# Patient Record
Sex: Male | Born: 1960 | Race: White | Hispanic: No | Marital: Married | Smoking: Former smoker
Health system: Southern US, Community
[De-identification: ages and names within clinical notes are randomized; demographics above are authoritative.]

## PROBLEM LIST (undated history)

## (undated) DIAGNOSIS — D696 Thrombocytopenia, unspecified: Secondary | ICD-10-CM

## (undated) DIAGNOSIS — Z1211 Encounter for screening for malignant neoplasm of colon: Secondary | ICD-10-CM

## (undated) DIAGNOSIS — K219 Gastro-esophageal reflux disease without esophagitis: Secondary | ICD-10-CM

## (undated) DIAGNOSIS — I10 Essential (primary) hypertension: Secondary | ICD-10-CM

## (undated) DIAGNOSIS — E669 Obesity, unspecified: Secondary | ICD-10-CM

## (undated) HISTORY — PX: SHOULDER SURGERY: SHX246

## (undated) HISTORY — PX: MULTIPLE TOOTH EXTRACTIONS: SHX2053

## (undated) HISTORY — DX: Thrombocytopenia, unspecified: D69.6

## (undated) HISTORY — DX: Gastro-esophageal reflux disease without esophagitis: K21.9

## (undated) HISTORY — PX: INNER EAR SURGERY: SHX679

## (undated) HISTORY — DX: Encounter for screening for malignant neoplasm of colon: Z12.11

## (undated) HISTORY — PX: OTHER SURGICAL HISTORY: SHX169

## (undated) HISTORY — DX: Obesity, unspecified: E66.9

## (undated) HISTORY — PX: LASIK: SHX215

---

## 2016-02-09 ENCOUNTER — Encounter: Payer: Self-pay | Admitting: Gastroenterology

## 2016-03-01 ENCOUNTER — Other Ambulatory Visit: Payer: Self-pay

## 2016-03-01 ENCOUNTER — Ambulatory Visit (INDEPENDENT_AMBULATORY_CARE_PROVIDER_SITE_OTHER): Payer: BLUE CROSS/BLUE SHIELD | Admitting: Gastroenterology

## 2016-03-01 ENCOUNTER — Encounter: Payer: Self-pay | Admitting: Gastroenterology

## 2016-03-01 VITALS — BP 190/95 | HR 87 | Temp 98.0°F | Ht 75.0 in | Wt 256.2 lb

## 2016-03-01 DIAGNOSIS — D696 Thrombocytopenia, unspecified: Secondary | ICD-10-CM

## 2016-03-01 DIAGNOSIS — K219 Gastro-esophageal reflux disease without esophagitis: Secondary | ICD-10-CM | POA: Diagnosis not present

## 2016-03-01 DIAGNOSIS — Z1211 Encounter for screening for malignant neoplasm of colon: Secondary | ICD-10-CM | POA: Insufficient documentation

## 2016-03-01 HISTORY — DX: Thrombocytopenia, unspecified: D69.6

## 2016-03-01 HISTORY — DX: Encounter for screening for malignant neoplasm of colon: Z12.11

## 2016-03-01 MED ORDER — NA SULFATE-K SULFATE-MG SULF 17.5-3.13-1.6 GM/177ML PO SOLN: 1.0000 | ORAL | 0 refills | Status: DC

## 2016-03-01 NOTE — Assessment & Plan Note (Addendum)
NO WARNING SIGNS/SYMPTOMS-BMI > 30. MAY BE DUE TO TWICE WEEKLY BLOOD DONATION FOR 8 WEEKS. RISK FACTOR FOR CIRRHOSIS: OBESITY.  CHECK CCB TODAY. WILL RISK STRATIFY IF NEEDED. IF PLT CT LOW, WILL NEED U/S AND HEP C Ab.

## 2016-03-01 NOTE — Assessment & Plan Note (Addendum)
SYMPTOMS CONTROLLED/RESOLVED ON OMEPRAZOLE.  LOSE WEIGHT DRINK WATER AVOID TRIGGERS.  HANDOUT GIVEN. EGD TO SCREEN FOR BARRETT'S ESOPHAGUS. DISCUSSED PROCEDURE, BENEFITS, & RISKS: < 1% chance of medication reaction, bleeding, OR perforation. OPV TBS SCHEDULED AFTER ENDOSCOPY.

## 2016-03-01 NOTE — Progress Notes (Signed)
   Subjective:    Patient ID: Edwin Silva, male    DOB: Jan 28, 1961, 56 y.o.   MRN: [id number]  Edwin Amass, MD  HPI No questions or concerns. MAY HAVE CONSTIPATION. WEIGHT LOSS: NONE. CHANGE IN BOWEL HABITS. HEARTBURN CONTROLLED WITH OMEPRAZOLE. IBUPROFEN 800 MG QHS:1-2X/WEEK. NO ASPIRIN, OR BC/GOODY'S. NO TATTOOS OR BLOOD TRANSFUSION. NO RECREATIONAL SUBSTANCES OR TRAVEL OUTSIDE THE [place].  PT DENIES FEVER, CHILLS, HEMATOCHEZIA, HEMATEMESIS, nausea, vomiting, melena, diarrhea, CHEST PAIN, SHORTNESS OF BREATH, CHANGE IN BOWEL IN HABITS, constipation, abdominal pain, problems swallowing, problems with sedation, heartburn or indigestion.  Past Medical History:  Diagnosis Date  . GERD (gastroesophageal reflux disease)   . Obesity (BMI 30-39.9)    Past Surgical History:  Procedure Laterality Date  . INNER EAR SURGERY Left   . LASIK Bilateral   . MULTIPLE TOOTH EXTRACTIONS     AGE 63  . SHOULDER SURGERY Right   . SHOULDER SURGERY Left    SHAVED BONE SPUR  . SURGERY, KNEE Left    No Known Allergies  Current Outpatient Prescriptions  Medication Sig Dispense Refill  . acetaminophen (TYLENOL) 500 MG tablet Take 1,000 mg by mouth every 6 (six) hours as needed.    Marland Kitchen ibuprofen (ADVIL,MOTRIN) 200 MG tablet Take 800 mg by mouth every 6 (six) hours as needed.    Marland Kitchen omeprazole (PRILOSEC) 40 MG capsule Take 40 mg by mouth daily.     Family History  Problem Relation Age of Onset  . Colon polyps Brother    Social History   Social History  . Marital status: Married    Spouse name: N/A  . Number of children: 2  . Years of education: [place]   Occupational History  . CONSTRUCTION    Social History Main Topics  . Smoking status: Former Research scientist (life sciences)  . Smokeless tobacco: Former Systems developer     Comment: quit 03/07/02  . Alcohol use No  . Drug use: No  . Sexual activity: Not Asked   Other Topics Concern  . 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   Review of Systems MAY HAVE BRONCHITIS WHEN WEATHER CHANGES    Objective:   Physical Exam  Constitutional: He is oriented to person, place, and time. He appears well-developed and well-nourished. No distress.  HENT:  Head: Normocephalic and atraumatic.  Mouth/Throat: Oropharynx is clear and moist. No oropharyngeal exudate.  Eyes: Pupils are equal, round, and reactive to light. No scleral icterus.  Neck: Normal range of motion. Neck supple.  Cardiovascular: Normal rate, regular rhythm and normal heart sounds.   Pulmonary/Chest: Effort normal and breath sounds normal. No respiratory distress.  Abdominal: Soft. Bowel sounds are normal. He exhibits no distension. There is no tenderness.  Musculoskeletal: He exhibits no edema.  Lymphadenopathy:    He has no cervical adenopathy.  Neurological: He is alert and oriented to person, place, and time.  Psychiatric: He has a normal mood and affect.  Vitals reviewed.     Assessment & Plan:

## 2016-03-01 NOTE — Assessment & Plan Note (Signed)
ONE FIRST DEGREE RELATIVE WITH COLON POLYPS.   COLONOSCOPY WITHIN THE NEXT TWO WEEKS. FOLLOW A FULL LIQUID DIET ON THE DAY BEFORE YOUR ENDOSCOPY. DISCUSSED PROCEDURE,COLOWRAP, BENEFITS, & RISKS: < 1% chance of medication reaction, bleeding, perforation, or rupture of spleen/liver.

## 2016-03-01 NOTE — Patient Instructions (Addendum)
COMPLETE BLOOD DRAW TODAY.  WE WILL SCHEDULE YOUR UPPER ENDOSCOPY AND COLONOSCOPY WITHIN THE NEXT TWO WEEKS. FOLLOW A FULL LIQUID DIET ON THE DAY BEFORE YOUR ENDOSCOPY. SEE INFO BELOW.  FOLLOW UP WILL BE SCHEDULED IF NEEDED.  Full Liquid Diet A high-calorie, high-protein supplement should be used to meet your nutritional requirements when the full liquid diet is continued for more than 2 or 3 days. If this diet is to be used for an extended period of time (more than 7 days), a multivitamin should be considered.  Breads and Starches  Allowed: None are allowed   Avoid: Any others.    Potatoes/Pasta/Rice  Allowed: ANY ITEM AS A SOUP OR SMALL PLATE OF MASHED POTATOES OR SCRAMBLED EGGS. (DO NOT EAT MORE THAN ONE SERVING ON THE DAY BEFORE COLONOSCOPY).      Vegetables  Allowed: Strained tomato or vegetable juice. Vegetables pureed in soup.   Avoid: Any others.    Fruit  Allowed: Any strained fruit juices and fruit drinks. Include 1 serving of citrus or vitamin C-enriched fruit juice daily.   Avoid: Any others.  Meat and Meat Substitutes  Allowed: Egg  Avoid: Any meat, fish, or fowl. All cheese.  Milk  Allowed: SOY Milk beverages, including milk shakes and instant breakfast mixes. Smooth yogurt.   Avoid: Any others. Avoid dairy products if not tolerated.    Soups and Combination Foods  Allowed: Broth, strained cream soups. Strained, broth-based soups.   Avoid: Any others.    Desserts and Sweets  Allowed: flavored gelatin, tapioca, ice cream, sherbet, smooth pudding, junket, fruit ices, frozen ice pops, pudding pops, frozen fudge pops, chocolate syrup. Sugar, honey, jelly, syrup.   Avoid: Any others.  Fats and Oils  Allowed: Margarine, butter, cream, sour cream, oils.   Avoid: Any others.  Beverages  Allowed: All.   Avoid: None.  Condiments  Allowed: Iodized salt, pepper, spices, flavorings. Cocoa powder.   Avoid: Any others.    SAMPLE MEAL  PLAN Breakfast   cup orange juice.   1 OR 2 EGGS  1 cup milk.   1 cup beverage (coffee or tea).   Cream or sugar, if desired.    Midmorning Snack  2 SCRAMBLED OR HARD BOILED EGG   Lunch  1 cup cream soup.    cup fruit juice.   1 cup milk.    cup custard.   1 cup beverage (coffee or tea).   Cream or sugar, if desired.    Midafternoon Snack  1 cup milk shake.  Dinner  1 cup cream soup.    cup fruit juice.   1 cup MILK    cup pudding.   1 cup beverage (coffee or tea).   Cream or sugar, if desired.  Evening Snack  1 cup supplement.  To increase calories, add sugar, cream, butter, or margarine if possible. Nutritional supplements will also increase the total calories.

## 2016-03-02 ENCOUNTER — Telehealth: Payer: Self-pay | Admitting: Gastroenterology

## 2016-03-02 ENCOUNTER — Other Ambulatory Visit: Payer: Self-pay

## 2016-03-02 DIAGNOSIS — D696 Thrombocytopenia, unspecified: Secondary | ICD-10-CM

## 2016-03-02 LAB — CBC WITH DIFFERENTIAL/PLATELET
BASOS PCT: 0 %
Basophils Absolute: 0 cells/uL (ref 0–200)
EOS PCT: 5 %
Eosinophils Absolute: 350 cells/uL (ref 15–500)
HEMATOCRIT: 48.8 % (ref 38.5–50.0)
Hemoglobin: 16.4 g/dL (ref 13.2–17.1)
LYMPHS ABS: 1260 {cells}/uL (ref 850–3900)
LYMPHS PCT: 18 %
MCH: 30.3 pg (ref 27.0–33.0)
MCHC: 33.6 g/dL (ref 32.0–36.0)
MCV: 90 fL (ref 80.0–100.0)
MPV: 9.9 fL (ref 7.5–12.5)
Monocytes Absolute: 910 cells/uL (ref 200–950)
Monocytes Relative: 13 %
NEUTROS PCT: 64 %
Neutro Abs: 4480 cells/uL (ref 1500–7800)
PLATELETS: 122 10*3/uL — AB (ref 140–400)
RBC: 5.42 MIL/uL (ref 4.20–5.80)
RDW: 14.1 % (ref 11.0–15.0)
WBC: 7 10*3/uL (ref 3.8–10.8)

## 2016-03-02 NOTE — Telephone Encounter (Signed)
Pt is set up for [place] on 03/08/16 @ 8:15 am. He is aware of appointment

## 2016-03-02 NOTE — Telephone Encounter (Signed)
Pt is aware to go to [facility] for the lab and OK to schedule the [place].

## 2016-03-02 NOTE — Telephone Encounter (Signed)
PLEASE CALL PT. HIS PLATELET COUNT IS STILL LOW. HE NEEDS TO HAVE A HEP C Ab AND AN ABDOMINAL ULTRASOUND, Dx: THROMBOCYTOPENIA, EVALUATE FOR CIRRHOSIS OR SPLENOMEGALY.

## 2016-03-06 NOTE — Progress Notes (Signed)
CC'ED TO PCP 

## 2016-03-08 ENCOUNTER — Ambulatory Visit (HOSPITAL_COMMUNITY)
Admission: RE | Admit: 2016-03-08 | Discharge: 2016-03-08 | Disposition: A | Discharge status: Home / Self Care | Payer: BLUE CROSS/BLUE SHIELD | Source: Ambulatory Visit | Attending: Gastroenterology | Admitting: Gastroenterology

## 2016-03-08 DIAGNOSIS — R140 Abdominal distension (gaseous): Secondary | ICD-10-CM | POA: Insufficient documentation

## 2016-03-08 DIAGNOSIS — R932 Abnormal findings on diagnostic imaging of liver and biliary tract: Secondary | ICD-10-CM | POA: Insufficient documentation

## 2016-03-08 DIAGNOSIS — D696 Thrombocytopenia, unspecified: Secondary | ICD-10-CM | POA: Diagnosis present

## 2016-03-09 LAB — HEPATITIS C ANTIBODY: HCV Ab: NEGATIVE

## 2016-03-12 ENCOUNTER — Telehealth: Payer: Self-pay | Admitting: Gastroenterology

## 2016-03-12 NOTE — Telephone Encounter (Addendum)
PLEASE CALL PT. HE DOES NOT HAVE HEPATITIS C. HIS U/S SHOWS FATTY LIVER BUT A NORMAL SPLEEN. HE SHOULD SEE HEMATOLOGY TO EVALUATE LOW PLATELET COUNT. HE SHOULD AVOID IBUPROFEN AND ASPIRIN PRODUCTS BECAUSE THEY WILL INCREASE HIS RISK FOR BLEEDING. HE SHOULD OBTAIN A MEDICAL ALERT BRACELET THAT STATES HE HAS THROMBOCYTOPENIA.

## 2016-03-13 ENCOUNTER — Other Ambulatory Visit: Payer: Self-pay

## 2016-03-13 DIAGNOSIS — D696 Thrombocytopenia, unspecified: Secondary | ICD-10-CM

## 2016-03-13 NOTE — Telephone Encounter (Signed)
PT is aware and I am sending this print out to him. He also asked if Dr. Oneida Alar is going to proceed with the colonoscopy.  Please advise!

## 2016-03-13 NOTE — Telephone Encounter (Signed)
Pt is aware.  

## 2016-03-13 NOTE — Telephone Encounter (Signed)
PLEASE CALL PT. He CAN STILL HAVE HIS COLONOSCOPY/

## 2016-03-13 NOTE — Telephone Encounter (Signed)
referral has been made  

## 2016-03-20 ENCOUNTER — Encounter (HOSPITAL_COMMUNITY): Payer: Self-pay | Admitting: *Deleted

## 2016-03-20 ENCOUNTER — Ambulatory Visit (HOSPITAL_COMMUNITY)
Admission: RE | Admit: 2016-03-20 | Discharge: 2016-03-20 | Disposition: A | Discharge status: Home Health | Payer: BLUE CROSS/BLUE SHIELD | Source: Ambulatory Visit | Attending: Gastroenterology | Admitting: Gastroenterology

## 2016-03-20 ENCOUNTER — Encounter (HOSPITAL_COMMUNITY)
Admission: RE | Disposition: A | Discharge status: Home Health | Payer: Self-pay | Source: Ambulatory Visit | Attending: Gastroenterology

## 2016-03-20 DIAGNOSIS — K648 Other hemorrhoids: Secondary | ICD-10-CM | POA: Diagnosis not present

## 2016-03-20 DIAGNOSIS — Z1211 Encounter for screening for malignant neoplasm of colon: Secondary | ICD-10-CM | POA: Insufficient documentation

## 2016-03-20 DIAGNOSIS — K2980 Duodenitis without bleeding: Secondary | ICD-10-CM | POA: Insufficient documentation

## 2016-03-20 DIAGNOSIS — Z1212 Encounter for screening for malignant neoplasm of rectum: Secondary | ICD-10-CM

## 2016-03-20 DIAGNOSIS — K621 Rectal polyp: Secondary | ICD-10-CM | POA: Diagnosis not present

## 2016-03-20 DIAGNOSIS — K219 Gastro-esophageal reflux disease without esophagitis: Secondary | ICD-10-CM | POA: Diagnosis not present

## 2016-03-20 DIAGNOSIS — E669 Obesity, unspecified: Secondary | ICD-10-CM | POA: Diagnosis not present

## 2016-03-20 DIAGNOSIS — D122 Benign neoplasm of ascending colon: Secondary | ICD-10-CM | POA: Diagnosis not present

## 2016-03-20 DIAGNOSIS — Z87891 Personal history of nicotine dependence: Secondary | ICD-10-CM | POA: Insufficient documentation

## 2016-03-20 DIAGNOSIS — Z13810 Encounter for screening for upper gastrointestinal disorder: Secondary | ICD-10-CM

## 2016-03-20 DIAGNOSIS — K2970 Gastritis, unspecified, without bleeding: Secondary | ICD-10-CM | POA: Diagnosis not present

## 2016-03-20 DIAGNOSIS — Z8371 Family history of colonic polyps: Secondary | ICD-10-CM | POA: Insufficient documentation

## 2016-03-20 DIAGNOSIS — K2950 Unspecified chronic gastritis without bleeding: Secondary | ICD-10-CM | POA: Insufficient documentation

## 2016-03-20 DIAGNOSIS — K317 Polyp of stomach and duodenum: Secondary | ICD-10-CM | POA: Insufficient documentation

## 2016-03-20 DIAGNOSIS — K2990 Gastroduodenitis, unspecified, without bleeding: Secondary | ICD-10-CM

## 2016-03-20 DIAGNOSIS — D123 Benign neoplasm of transverse colon: Secondary | ICD-10-CM | POA: Diagnosis not present

## 2016-03-20 DIAGNOSIS — Z6832 Body mass index (BMI) 32.0-32.9, adult: Secondary | ICD-10-CM | POA: Diagnosis not present

## 2016-03-20 DIAGNOSIS — D125 Benign neoplasm of sigmoid colon: Secondary | ICD-10-CM | POA: Insufficient documentation

## 2016-03-20 DIAGNOSIS — K228 Other specified diseases of esophagus: Secondary | ICD-10-CM | POA: Diagnosis not present

## 2016-03-20 DIAGNOSIS — K222 Esophageal obstruction: Secondary | ICD-10-CM | POA: Insufficient documentation

## 2016-03-20 DIAGNOSIS — K449 Diaphragmatic hernia without obstruction or gangrene: Secondary | ICD-10-CM | POA: Insufficient documentation

## 2016-03-20 DIAGNOSIS — I8500 Esophageal varices without bleeding: Secondary | ICD-10-CM | POA: Insufficient documentation

## 2016-03-20 DIAGNOSIS — D128 Benign neoplasm of rectum: Secondary | ICD-10-CM

## 2016-03-20 HISTORY — PX: POLYPECTOMY: SHX5525

## 2016-03-20 HISTORY — PX: COLONOSCOPY: SHX5424

## 2016-03-20 HISTORY — PX: BIOPSY: SHX5522

## 2016-03-20 HISTORY — PX: ESOPHAGOGASTRODUODENOSCOPY: SHX5428

## 2016-03-20 SURGERY — COLONOSCOPY
Anesthesia: Moderate Sedation

## 2016-03-20 MED ORDER — MIDAZOLAM HCL 5 MG/5ML IJ SOLN
INTRAMUSCULAR | Status: AC
  Filled 2016-03-20: qty 10

## 2016-03-20 MED ORDER — STERILE WATER FOR IRRIGATION IR SOLN
Status: DC | PRN
  Administered 2016-03-20: 13:00:00

## 2016-03-20 MED ORDER — LIDOCAINE VISCOUS 2 % MT SOLN
OROMUCOSAL | Status: AC
  Filled 2016-03-20: qty 15

## 2016-03-20 MED ORDER — SODIUM CHLORIDE 0.9 % IV SOLN
INTRAVENOUS | Status: DC
  Administered 2016-03-20: 12:00:00 via INTRAVENOUS

## 2016-03-20 MED ORDER — MIDAZOLAM HCL 5 MG/5ML IJ SOLN
INTRAMUSCULAR | Status: DC | PRN
  Administered 2016-03-20: 1 mg via INTRAVENOUS
  Administered 2016-03-20: 2 mg via INTRAVENOUS
  Administered 2016-03-20: 1 mg via INTRAVENOUS
  Administered 2016-03-20 (×2): 2 mg via INTRAVENOUS

## 2016-03-20 MED ORDER — MEPERIDINE HCL 100 MG/ML IJ SOLN
INTRAMUSCULAR | Status: DC | PRN
  Administered 2016-03-20 (×4): 25 mg via INTRAVENOUS

## 2016-03-20 MED ORDER — MEPERIDINE HCL 100 MG/ML IJ SOLN
INTRAMUSCULAR | Status: AC
  Filled 2016-03-20: qty 2

## 2016-03-20 NOTE — Progress Notes (Signed)
Edwin Silva may be excused from work on Tuesday, March 21, 2016. He had a procedure at [facility] on 03/20/2016 and can not drive or operate machinery for 24 hours. He may return to work on Wednesday, March 22, 2016.Questions???, contact Lurline Del, RN at [phone].Thanks.

## 2016-03-20 NOTE — Op Note (Signed)
[facility] Patient Name: Edwin Silva Procedure Date: 03/20/2016 12:49 PM MRN: [id number] Date of Birth: [birth year] Attending MD: Barney Drain , MD CSN: [id number] Age: 56 Admit Type: Outpatient Procedure:                Colonoscopy WITH SNARE POLYPECTOMY Indications:              Screening for colorectal malignant neoplasm Providers:                Barney Drain, MD, Otis Peak B. Sharon Seller, RN, Isabella Stalling, Technician Referring MD:             [name] Medicines:                Meperidine 75 mg IV, Midazolam 6 mg IV Complications:            No immediate complications. Estimated Blood Loss:     Estimated blood loss was minimal. Procedure:                Pre-Anesthesia Assessment:                           - Prior to the procedure, a History and Physical                            was performed, and patient medications and                            allergies were reviewed. The patient's tolerance of                            previous anesthesia was also reviewed. The risks                            and benefits of the procedure and the sedation                            options and risks were discussed with the patient.                            All questions were answered, and informed consent                            was obtained. Prior Anticoagulants: The patient has                            taken ibuprofen. ASA Grade Assessment: II - A                            patient with mild systemic disease. After reviewing                            the risks and benefits, the patient was deemed in  satisfactory condition to undergo the procedure.                            After obtaining informed consent, the colonoscope                            was passed under direct vision. Throughout the                            procedure, the patient's blood pressure, pulse, and                            oxygen  saturations were monitored continuously. The                            Colonoscope was introduced through the anus and                            advanced to the the cecum, identified by                            appendiceal orifice and ileocecal valve. The                            colonoscopy was performed without difficulty. The                            patient tolerated the procedure well. The quality                            of the bowel preparation was excellent. The                            ileocecal valve, appendiceal orifice, and rectum                            were photographed. Scope In: 1:10:18 PM Scope Out: 1:39:26 PM Total Procedure Duration: 0 hours 29 minutes 8 seconds  Findings:      Eight sessile polyps were found in the rectum, sigmoid colon, splenic       flexure, hepatic flexure and ascending colon. The polyps were 5 to 8 mm       in size. These polyps were removed with a hot snare. Resection and       retrieval were complete.      Two sessile polyps were found in the hepatic flexure and ascending       colon. The polyps were 3 to 5 mm in size. These polyps were removed with       a cold snare. Resection and retrieval were complete.      Internal hemorrhoids were found during retroflexion. The hemorrhoids       were small. Impression:               - Eight 5 to 8 mm polyps in the rectum, in the  sigmoid colon, at the splenic flexure, at the                            hepatic flexure and in the ascending colon, removed                            with a hot snare. Resected and retrieved.                           - Two 3 to 5 mm polyps at the hepatic flexure and                            in the ascending colon, removed with a cold snare.                            Resected and retrieved.                           - Internal hemorrhoids. Moderate Sedation:      Moderate (conscious) sedation was administered by the endoscopy nurse        and supervised by the endoscopist. The following parameters were       monitored: oxygen saturation, heart rate, blood pressure, and response       to care. Total physician intraservice time was 69 minutes. Recommendation:           - Repeat colonoscopy 1-3 YEARS for surveillance.                           - High fiber diet and low fat diet.                           - Continue present medications.                           - Await pathology results.                           - Patient has a contact number available for                            emergencies. The signs and symptoms of potential                            delayed complications were discussed with the                            patient. Return to normal activities tomorrow.                            Written discharge instructions were provided to the                            patient. Procedure Code(s):        --- Professional ---  [phone], Colonoscopy, flexible; with removal of                            tumor(s), polyp(s), or other lesion(s) by snare                            technique                           99152, Moderate sedation services provided by the                            same physician or other qualified health care                            professional performing the diagnostic or                            therapeutic service that the sedation supports,                            requiring the presence of an independent trained                            observer to assist in the monitoring of the                            patient's level of consciousness and physiological                            status; initial 15 minutes of intraservice time,                            patient age 74 years or older                           [phone], Moderate sedation services; each additional                            15 minutes intraservice time                           99153,  Moderate sedation services; each additional                            15 minutes intraservice time                           99153, Moderate sedation services; each additional                            15 minutes intraservice time                           99153, Moderate sedation services; each additional  15 minutes intraservice time Diagnosis Code(s):        --- Professional ---                           Z12.11, Encounter for screening for malignant                            neoplasm of colon                           K62.1, Rectal polyp                           D12.5, Benign neoplasm of sigmoid colon                           D12.3, Benign neoplasm of transverse colon (hepatic                            flexure or splenic flexure)                           D12.2, Benign neoplasm of ascending colon                           K64.8, Other hemorrhoids CPT copyright 2016 American Medical Association. All rights reserved. The codes documented in this report are preliminary and upon coder review may  be revised to meet current compliance requirements. Barney Drain, MD Barney Drain, MD 03/20/2016 2:24:48 PM This report has been signed electronically. Number of Addenda: 0

## 2016-03-20 NOTE — H&P (View-Only) (Signed)
   Subjective:    Patient ID: Edwin Silva, male    DOB: 1960-11-25, 56 y.o.   MRN: [id number]  [name], MD  HPI No questions or concerns. MAY HAVE CONSTIPATION. WEIGHT LOSS: NONE. CHANGE IN BOWEL HABITS. HEARTBURN CONTROLLED WITH OMEPRAZOLE. IBUPROFEN 800 MG QHS:1-2X/WEEK. NO ASPIRIN, OR BC/GOODY'S. NO TATTOOS OR BLOOD TRANSFUSION. NO RECREATIONAL SUBSTANCES OR TRAVEL OUTSIDE THE [place].  PT DENIES FEVER, CHILLS, HEMATOCHEZIA, HEMATEMESIS, nausea, vomiting, melena, diarrhea, CHEST PAIN, SHORTNESS OF BREATH, CHANGE IN BOWEL IN HABITS, constipation, abdominal pain, problems swallowing, problems with sedation, heartburn or indigestion.  Past Medical History:  Diagnosis Date  . GERD (gastroesophageal reflux disease)   . Obesity (BMI 30-39.9)    Past Surgical History:  Procedure Laterality Date  . INNER EAR SURGERY Left   . LASIK Bilateral   . MULTIPLE TOOTH EXTRACTIONS     AGE 14  . SHOULDER SURGERY Right   . SHOULDER SURGERY Left    SHAVED BONE SPUR  . SURGERY, KNEE Left    No Known Allergies  Current Outpatient Prescriptions  Medication Sig Dispense Refill  . acetaminophen (TYLENOL) 500 MG tablet Take 1,000 mg by mouth every 6 (six) hours as needed.    Marland Kitchen ibuprofen (ADVIL,MOTRIN) 200 MG tablet Take 800 mg by mouth every 6 (six) hours as needed.    Marland Kitchen omeprazole (PRILOSEC) 40 MG capsule Take 40 mg by mouth daily.     Family History  Problem Relation Age of Onset  . Colon polyps Brother    Social History   Social History  . Marital status: Married    Spouse name: N/A  . Number of children: 2  . Years of education: [place]   Occupational History  . CONSTRUCTION    Social History Main Topics  . Smoking status: Former Research scientist (life sciences)  . Smokeless tobacco: Former Systems developer     Comment: quit 03/07/02  . Alcohol use No  . Drug use: No  . Sexual activity: Not Asked   Other Topics Concern  . 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   Review of Systems MAY HAVE BRONCHITIS WHEN WEATHER CHANGES    Objective:   Physical Exam  Constitutional: He is oriented to person, place, and time. He appears well-developed and well-nourished. No distress.  HENT:  Head: Normocephalic and atraumatic.  Mouth/Throat: Oropharynx is clear and moist. No oropharyngeal exudate.  Eyes: Pupils are equal, round, and reactive to light. No scleral icterus.  Neck: Normal range of motion. Neck supple.  Cardiovascular: Normal rate, regular rhythm and normal heart sounds.   Pulmonary/Chest: Effort normal and breath sounds normal. No respiratory distress.  Abdominal: Soft. Bowel sounds are normal. He exhibits no distension. There is no tenderness.  Musculoskeletal: He exhibits no edema.  Lymphadenopathy:    He has no cervical adenopathy.  Neurological: He is alert and oriented to person, place, and time.  Psychiatric: He has a normal mood and affect.  Vitals reviewed.     Assessment & Plan:

## 2016-03-20 NOTE — Interval H&P Note (Signed)
History and Physical Interval Note:  03/20/2016 12:49 PM  Edwin Silva  has presented today for surgery, with the diagnosis of SCREENING  The various methods of treatment have been discussed with the patient and family. After consideration of risks, benefits and other options for treatment, the patient has consented to  Procedure(s) with comments: COLONOSCOPY (N/A) - 100 ESOPHAGOGASTRODUODENOSCOPY (EGD) (N/A) as a surgical intervention .  The patient's history has been reviewed, patient examined, no change in status, stable for surgery.  I have reviewed the patient's chart and labs.  Questions were answered to the patient's satisfaction.     [employer]

## 2016-03-20 NOTE — Op Note (Signed)
[facility] Patient Name: Edwin Silva Procedure Date: 03/20/2016 1:40 PM MRN: [id number] Date of Birth: 08/02/1960 Attending MD: Barney Drain , MD CSN: [id number] Age: 56 Admit Type: Outpatient Procedure:                Upper GI endoscopy WITH SNARE POLYPECTOMY & COLD                            FORCEPS BIOPSY Indications:              Screening for Barrett's esophagus, Variceal                            screening (no known varices or prior bleeding) Providers:                Barney Drain, MD, Otis Peak B. Sharon Seller, RN, Isabella Stalling, Technician Referring MD:             [name] Medicines:                TCS + Meperidine 25 mg IV, Midazolam 2 mg IV Complications:            No immediate complications. Estimated Blood Loss:     Estimated blood loss was minimal. Procedure:                Pre-Anesthesia Assessment:                           - Prior to the procedure, a History and Physical                            was performed, and patient medications and                            allergies were reviewed. The patient's tolerance of                            previous anesthesia was also reviewed. The risks                            and benefits of the procedure and the sedation                            options and risks were discussed with the patient.                            All questions were answered, and informed consent                            was obtained. Prior Anticoagulants: The patient has                            taken ibuprofen. ASA Grade Assessment: II - A  patient with mild systemic disease. After reviewing                            the risks and benefits, the patient was deemed in                            satisfactory condition to undergo the procedure.                            After obtaining informed consent, the endoscope was                            passed under direct vision.  Throughout the                            procedure, the patient's blood pressure, pulse, and                            oxygen saturations were monitored continuously. The                            [id number]) scope was introduced through the                            mouth, and advanced to the second part of duodenum.                            The upper GI endoscopy was accomplished without                            difficulty. The patient tolerated the procedure                            well. Scope In: 1:51:07 PM Scope Out: 2:04:48 PM Total Procedure Duration: 0 hours 13 minutes 41 seconds  Findings:      One moderate (circumferential scarring or stenosis; an endoscope may       pass) benign-appearing, intrinsic stenosis was found. And was traversed.      One 4 mm polyp was found. The polyp was removed with a hot snare.       Resection and retrieval were complete.      A small hiatal hernia was present.      A single 12 mm sessile polyp with no stigmata of recent bleeding was       found in the gastric body. The polyp was removed with a hot snare.       Resection and retrieval were complete.      Patchy moderate inflammation characterized by congestion (edema),       erosions and erythema was found in the gastric body and in the gastric       antrum. Biopsies were taken with a cold forceps for Helicobacter pylori       testing.      Patchy mild inflammation characterized by congestion (edema) and       erythema was found in the duodenal bulb.      The  second portion of the duodenum was normal.      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 They       were small in size. Impression:               - PEPTIC STRICTURE                           - Esophageal polyp(s) were found. Resected and                            retrieved.                           - Small hiatal hernia.                           - A single gastric polyp. Resected and  retrieved.                           - MODERATE Gastritis AND MILD DUODENITIS                           - Moderate Sedation:      Moderate (conscious) sedation was administered by the endoscopy nurse       and supervised by the endoscopist. The following parameters were       monitored: oxygen saturation, heart rate, blood pressure, and response       to care. Total physician intraservice time was 69 minutes. Recommendation:           - High fiber diet and low fat diet.                           - Continue present medications.                           - Await pathology results.                           - Return to my office in 6 months.                           - Patient has a contact number available for                            emergencies. The signs and symptoms of potential                            delayed complications were discussed with the                            patient. Return to normal activities tomorrow.                            Written discharge instructions were provided to the  patient. Procedure Code(s):        --- Professional ---                           [phone], Esophagogastroduodenoscopy, flexible,                            transoral; with removal of tumor(s), polyp(s), or                            other lesion(s) by snare technique                           43239, 59, Esophagogastroduodenoscopy, flexible,                            transoral; with biopsy, single or multiple                           99152, Moderate sedation services provided by the                            same physician or other qualified health care                            professional performing the diagnostic or                            therapeutic service that the sedation supports,                            requiring the presence of an independent trained                            observer to assist in the monitoring of the                             patient's level of consciousness and physiological                            status; initial 15 minutes of intraservice time,                            patient age 52 years or older                           [phone], Moderate sedation services; each additional                            15 minutes intraservice time                           99153, Moderate sedation services; each additional                            15 minutes intraservice time  [id number], Moderate sedation services; each additional                            15 minutes intraservice time                           99153, Moderate sedation services; each additional                            15 minutes intraservice time Diagnosis Code(s):        --- Professional ---                           K22.2, Esophageal obstruction                           K22.8, Other specified diseases of esophagus                           K44.9, Diaphragmatic hernia without obstruction or                            gangrene                           K31.7, Polyp of stomach and duodenum                           K29.70, Gastritis, unspecified, without bleeding                           K29.80, Duodenitis without bleeding                           Z13.810, Encounter for screening for upper                            gastrointestinal disorder CPT copyright 2016 [employer]. All rights reserved. The codes documented in this report are preliminary and upon coder review may  be revised to meet current compliance requirements. Barney Drain, MD Barney Drain, MD 03/20/2016 2:32:07 PM This report has been signed electronically. Number of Addenda: 0

## 2016-03-22 ENCOUNTER — Encounter (HOSPITAL_COMMUNITY): Payer: Self-pay | Admitting: Gastroenterology

## 2016-03-30 NOTE — Discharge Instructions (Signed)
You had 10 polyps removed. You have SMALL internal hemorrhoids. You have gastritis/DUODENITIS DUE TO IBUPROFEN, & a SMALL HIATAL HERNIA. YOU HAVE SMALL ESOPHAGEAL VARICES AND A STRICTURE AT THE BASE OF YOUR ESOPHAGUS DUE TO UNCONTROLLED acid REFLUX. YOU DO NOT HAVE BARRETT'S ESOPHAGUS. I REMOVED A ESOPHAGEAL NODULE AND A STOMACH POLYP. I biopsied your stomach.   Minimize the USE OF ASPIRIN, BC/GOODY POWDERS, IBUPROFEN/MOTRIN, OR NAPROXEN/ALEVE DUE TO YOUR HAVING A LOW PLATELET  COUNT.  DRINK WATER TO KEEP YOUR URINE LIGHT YELLOW.  FOLLOW A HIGH FIBER/LOW FAT DIET. AVOID ITEMS THAT CAUSE BLOATING. SEE INFO BELOW.  YOUR BIOPSY RESULTS WILL BE AVAILABLE IN MY CHART AFTER FEB 22 AND MY OFFICE WILL CONTACT YOU IN 10-14 DAYS WITH YOUR RESULTS.   FOLLOW UP IN 6 MOS.   Next colonoscopy in 1-3 years. YOUR SISTERS, BROTHERS, CHILDREN, AND PARENTS NEED TO HAVE A COLONOSCOPY STARTING AT THE AGE OF 40.     ENDOSCOPY Care After Read the instructions outlined below and refer to this sheet in the next week. These discharge instructions provide you with general information on caring for yourself after you leave the hospital. While your treatment has been planned according to the most current medical practices available, unavoidable complications occasionally occur. If you have any problems or questions after discharge, call DR. Jaece Ducharme, [phone].  ACTIVITY  You may resume your regular activity, but move at a slower pace for the next 24 hours.   Take frequent rest periods for the next 24 hours.   Walking will help get rid of the air and reduce the bloated feeling in your belly (abdomen).   No driving for 24 hours (because of the medicine (anesthesia) used during the test).   You may shower.   Do not sign any important legal documents or operate any machinery for 24 hours (because of the anesthesia used during the test).    NUTRITION  Drink plenty of fluids.   You may resume your normal diet  as instructed by your doctor.   Begin with a light meal and progress to your normal diet. Heavy or fried foods are harder to digest and may make you feel sick to your stomach (nauseated).   Avoid alcoholic beverages for 24 hours or as instructed.    MEDICATIONS  You may resume your normal medications.   WHAT YOU CAN EXPECT TODAY  Some feelings of bloating in the abdomen.   Passage of more gas than usual.   Spotting of blood in your stool or on the toilet paper  .  IF YOU HAD POLYPS REMOVED DURING THE ENDOSCOPY:  Eat a soft diet IF YOU HAVE NAUSEA, BLOATING, ABDOMINAL PAIN, OR VOMITING.    FINDING OUT THE RESULTS OF YOUR TEST Not all test results are available during your visit. DR. Oneida Alar WILL CALL YOU WITHIN 14 DAYS OF YOUR PROCEDUE WITH YOUR RESULTS. Do not assume everything is normal if you have not heard from DR. Mong Neal, CALL HER OFFICE AT [phone].  SEEK IMMEDIATE MEDICAL ATTENTION AND CALL THE OFFICE: [phone] IF:  You have more than a spotting of blood in your stool.   Your belly is swollen (abdominal distention).   You are nauseated or vomiting.   You have a temperature over 101F.   You have abdominal pain or discomfort that is severe or gets worse throughout the day.   Gastritis  Gastritis is an inflammation (the body's way of reacting to injury and/or infection) of the stomach. It is often caused  by viral or bacterial (germ) infections. It can also be caused BY ASPIRIN, BC/GOODY POWDER'S, (IBUPROFEN) MOTRIN, OR ALEVE (NAPROXEN), chemicals (including alcohol), SPICY FOODS, and medications. This illness may be associated with generalized malaise (feeling tired, not well), UPPER ABDOMINAL STOMACH cramps, and fever. One common bacterial cause of gastritis is an organism known as H. Pylori. This can be treated with antibiotics.    Hiatal Hernia A hiatal hernia occurs when a part of the stomach slides above the diaphragm. The diaphragm is the thin muscle  separating the belly (abdomen) from the chest. A hiatal hernia can be something you are born with or develop over time. Hiatal hernias may allow stomach acid to flow back into your esophagus, the tube which carries food from your mouth to your stomach. If this acid causes problems it is called GERD (gastro-esophageal reflux disease).   SYMPTOMS Common symptoms of GERD are heartburn (burning in your chest). This is worse when lying down or bending over. It may also cause belching and indigestion. Some of the things which make GERD worse are:  Increased weight pushes on stomach making acid rise more easily.   Smoking markedly increases acid production.   Alcohol decreases lower esophageal sphincter pressure (valve between stomach and esophagus), allowing acid from stomach into esophagus.   Late evening meals and going to bed with a full stomach increases pressure.    HOME CARE INSTRUCTIONS  Try to achieve and maintain an ideal body weight.   Avoid drinking alcoholic beverages.   DO NOT smokE.   Do not wear tight clothing around your chest or stomach.   Eat smaller meals and eat more frequently. This keeps your stomach from getting too full. Eat slowly.   Do not lie down for 2 or 3 hours after eating. Do not eat or drink anything 1 to 2 hours before going to bed.   Avoid caffeine beverages (colas, coffee, cocoa, tea), fatty foods, citrus fruits and all other foods and drinks that contain acid and that seem to increase the problems.   Avoid bending over, especially after eating OR STRAINING. Anything that increases the pressure in your belly increases the amount of acid that may be pushed up into your esophagus.    High-Fiber Diet A high-fiber diet changes your normal diet to include more whole grains, legumes, fruits, and vegetables. Changes in the diet involve replacing refined carbohydrates with unrefined foods. The calorie level of the diet is essentially unchanged. The Dietary  Reference Intake (recommended amount) for adult males is 38 grams per day. For adult females, it is 25 grams per day. Pregnant and lactating women should consume 28 grams of fiber per day. Fiber is the intact part of a plant that is not broken down during digestion. Functional fiber is fiber that has been isolated from the plant to provide a beneficial effect in the body. PURPOSE  Increase stool bulk.   Ease and regulate bowel movements.   Lower cholesterol.   REDUCE RISK OF COLON CANCER  INDICATIONS THAT YOU NEED MORE FIBER  Constipation and hemorrhoids.   Uncomplicated diverticulosis (intestine condition) and irritable bowel syndrome.   Weight management.   As a protective measure against hardening of the arteries (atherosclerosis), diabetes, and cancer.   GUIDELINES FOR INCREASING FIBER IN THE DIET  Start adding fiber to the diet slowly. A gradual increase of about 5 more grams (2 slices of whole-wheat bread, 2 servings of most fruits or vegetables, or 1 bowl of high-fiber cereal) per day  is best. Too rapid an increase in fiber may result in constipation, flatulence, and bloating.   Drink enough water and fluids to keep your urine clear or pale yellow. Water, juice, or caffeine-free drinks are recommended. Not drinking enough fluid may cause constipation.   Eat a variety of high-fiber foods rather than one type of fiber.   Try to increase your intake of fiber through using high-fiber foods rather than fiber pills or supplements that contain small amounts of fiber.   The goal is to change the types of food eaten. Do not supplement your present diet with high-fiber foods, but replace foods in your present diet.   INCLUDE A VARIETY OF FIBER SOURCES  Replace refined and processed grains with whole grains, canned fruits with fresh fruits, and incorporate other fiber sources. White rice, white breads, and most bakery goods contain little or no fiber.   Brown whole-grain rice,  buckwheat oats, and many fruits and vegetables are all good sources of fiber. These include: broccoli, Brussels sprouts, cabbage, cauliflower, beets, sweet potatoes, white potatoes (skin on), carrots, tomatoes, eggplant, squash, berries, fresh fruits, and dried fruits.   Cereals appear to be the richest source of fiber. Cereal fiber is found in whole grains and bran. Bran is the fiber-rich outer coat of cereal grain, which is largely removed in refining. In whole-grain cereals, the bran remains. In breakfast cereals, the largest amount of fiber is found in those with "bran" in their names. The fiber content is sometimes indicated on the label.   You may need to include additional fruits and vegetables each day.   In baking, for 1 cup white flour, you may use the following substitutions:   1 cup whole-wheat flour minus 2 tablespoons.   1/2 cup white flour plus 1/2 cup whole-wheat flour.   Low-Fat Diet BREADS, CEREALS, PASTA, RICE, DRIED PEAS, AND BEANS These products are high in carbohydrates and most are low in fat. Therefore, they can be increased in the diet as substitutes for fatty foods. They too, however, contain calories and should not be eaten in excess. Cereals can be eaten for snacks as well as for breakfast.  Include foods that contain fiber (fruits, vegetables, whole grains, and legumes). Research shows that fiber may lower blood cholesterol levels, especially the water-soluble fiber found in fruits, vegetables, oat products, and legumes. FRUITS AND VEGETABLES It is good to eat fruits and vegetables. Besides being sources of fiber, both are rich in vitamins and some minerals. They help you get the daily allowances of these nutrients. Fruits and vegetables can be used for snacks and desserts. MEATS Limit lean meat, chicken, [place], and fish to no more than 6 ounces per day. Beef, Pork, and Lamb Use lean cuts of beef, pork, and lamb. Lean cuts include:  Extra-lean ground beef.  Arm  roast.  Sirloin tip.  Center-cut ham.  Round steak.  Loin chops.  Rump roast.  Tenderloin.  Trim all fat off the outside of meats before cooking. It is not necessary to severely decrease the intake of red meat, but lean choices should be made. Lean meat is rich in protein and contains a highly absorbable form of iron. Premenopausal women, in particular, should avoid reducing lean red meat because this could increase the risk for low red blood cells (iron-deficiency anemia). The organ meats, such as liver, sweetbreads, kidneys, and brain are very rich in cholesterol. They should be limited. Chicken and [place] These are good sources of protein. The fat of poultry  can be reduced by removing the skin and underlying fat layers before cooking. Chicken and [place] can be substituted for lean red meat in the diet. Poultry should not be fried or covered with high-fat sauces. Fish and Shellfish Fish is a good source of protein. Shellfish contain cholesterol, but they usually are low in saturated fatty acids. The preparation of fish is important. Like chicken and [place], they should not be fried or covered with high-fat sauces. EGGS Egg whites contain no fat or cholesterol. They can be eaten often. Try 1 to 2 egg whites instead of whole eggs in recipes or use egg substitutes that do not contain yolk. MILK AND DAIRY PRODUCTS Use skim or 1% milk instead of 2% or whole milk. Decrease whole milk, natural, and processed cheeses. Use nonfat or low-fat (2%) cottage cheese or low-fat cheeses made from vegetable oils. Choose nonfat or low-fat (1 to 2%) yogurt. Experiment with evaporated skim milk in recipes that call for heavy cream. Substitute low-fat yogurt or low-fat cottage cheese for sour cream in dips and salad dressings. Have at least 2 servings of low-fat dairy products, such as 2 glasses of skim (or 1%) milk each day to help get your daily calcium intake.  FATS AND OILS Reduce the total intake of fats,  especially saturated fat. Butterfat, lard, and beef fats are high in saturated fat and cholesterol. These should be avoided as much as possible. Vegetable fats do not contain cholesterol, but certain vegetable fats, such as coconut oil, palm oil, and palm kernel oil are very high in saturated fats. These should be limited. These fats are often used in bakery goods, processed foods, popcorn, oils, and nondairy creamers. Vegetable shortenings and some peanut butters contain hydrogenated oils, which are also saturated fats. Read the labels on these foods and check for saturated vegetable oils. Unsaturated vegetable oils and fats do not raise blood cholesterol. However, they should be limited because they are fats and are high in calories. Total fat should still be limited to 30% of your daily caloric intake. Desirable liquid vegetable oils are corn oil, cottonseed oil, olive oil, canola oil, safflower oil, soybean oil, and sunflower oil. Peanut oil is not as good, but small amounts are acceptable. Buy a heart-healthy tub margarine that has no partially hydrogenated oils in the ingredients. Mayonnaise and salad dressings often are made from unsaturated fats, but they should also be limited because of their high calorie and fat content. Seeds, nuts, peanut butter, olives, and avocados are high in fat, but the fat is mainly the unsaturated type. These foods should be limited mainly to avoid excess calories and fat. OTHER EATING TIPS Snacks  Most sweets should be limited as snacks. They tend to be rich in calories and fats, and their caloric content outweighs their nutritional value. Some good choices in snacks are graham crackers, melba toast, soda crackers, bagels (no egg), English muffins, fruits, and vegetables. These snacks are preferable to snack crackers, [place] fries, and chips. Popcorn should be air-popped or cooked in small amounts of liquid vegetable oil. Desserts Eat fruit, low-fat yogurt, and fruit  ices. AVOID pastries, cake, and cookies. Sherbet, angel food cake, gelatin dessert, frozen low-fat yogurt, or other frozen products that do not contain saturated fat (pure fruit juice bars, frozen ice pops) are also acceptable.  COOKING METHODS Choose those methods that use little or no fat. They include: Poaching.  Braising.  Steaming.  Grilling.  Baking.  Stir-frying.  Broiling.  Microwaving.  Foods can  be cooked in a nonstick pan without added fat, or use a nonfat cooking spray in regular cookware. Limit fried foods and avoid frying in saturated fat. Add moisture to lean meats by using water, broth, cooking wines, and other nonfat or low-fat sauces along with the cooking methods mentioned above. Soups and stews should be chilled after cooking. The fat that forms on top after a few hours in the refrigerator should be skimmed off. When preparing meals, avoid using excess salt. Salt can contribute to raising blood pressure in some people. EATING AWAY FROM HOME Order entres, potatoes, and vegetables without sauces or butter. When meat exceeds the size of a deck of cards (3 to 4 ounces), the rest can be taken home for another meal. Choose vegetable or fruit salads and ask for low-calorie salad dressings to be served on the side. Use dressings sparingly. Limit high-fat toppings, such as bacon, crumbled eggs, cheese, sunflower seeds, and olives. Ask for heart-healthy tub margarine instead of butter.  Hemorrhoids Hemorrhoids are dilated (enlarged) veins around the rectum. Sometimes clots will form in the veins. This makes them swollen and painful. These are called thrombosed hemorrhoids. Causes of hemorrhoids include:  Constipation.   Straining to have a bowel movement.   HEAVY LIFTING  HOME CARE INSTRUCTIONS  Eat a well balanced diet and drink 6 to 8 glasses of water every day to avoid constipation. You may also use a bulk laxative.   Avoid straining to have bowel movements.   Keep anal  area dry and clean.   Do not use a donut shaped pillow or sit on the toilet for long periods. This increases blood pooling and pain.   Move your bowels when your body has the urge; this will require less straining and will decrease pain and pressure.

## 2016-03-30 NOTE — Progress Notes (Signed)
Pt is aware.  

## 2016-03-30 NOTE — Progress Notes (Signed)
OPV IN AUG 2018 E30 LOW PLTS, ADENOMAS, BARRETT'S ESOPHAGUS

## 2016-04-24 ENCOUNTER — Encounter (HOSPITAL_COMMUNITY): Payer: BLUE CROSS/BLUE SHIELD | Attending: Oncology | Admitting: Oncology

## 2016-04-24 ENCOUNTER — Other Ambulatory Visit (HOSPITAL_COMMUNITY): Payer: Self-pay

## 2016-04-24 ENCOUNTER — Encounter (HOSPITAL_COMMUNITY): Payer: BLUE CROSS/BLUE SHIELD

## 2016-04-24 ENCOUNTER — Encounter (HOSPITAL_COMMUNITY): Payer: Self-pay | Admitting: Oncology

## 2016-04-24 VITALS — BP 146/88 | HR 65 | Temp 98.5°F | Resp 16 | Ht 74.5 in | Wt 249.0 lb

## 2016-04-24 DIAGNOSIS — Z1211 Encounter for screening for malignant neoplasm of colon: Secondary | ICD-10-CM

## 2016-04-24 DIAGNOSIS — D696 Thrombocytopenia, unspecified: Secondary | ICD-10-CM | POA: Diagnosis present

## 2016-04-24 LAB — CBC WITH DIFFERENTIAL/PLATELET
BASOS ABS: 0 10*3/uL (ref 0.0–0.1)
Basophils Relative: 0 %
EOS ABS: 0.2 10*3/uL (ref 0.0–0.7)
EOS PCT: 3 %
HEMATOCRIT: 47.5 % (ref 39.0–52.0)
Hemoglobin: 16.5 g/dL (ref 13.0–17.0)
LYMPHS PCT: 15 %
Lymphs Abs: 1.1 10*3/uL (ref 0.7–4.0)
MCH: 30.5 pg (ref 26.0–34.0)
MCHC: 34.7 g/dL (ref 30.0–36.0)
MCV: 87.8 fL (ref 78.0–100.0)
Monocytes Absolute: 0.6 10*3/uL (ref 0.1–1.0)
Monocytes Relative: 9 %
Neutro Abs: 5.2 10*3/uL (ref 1.7–7.7)
Neutrophils Relative %: 73 %
PLATELETS: 127 10*3/uL — AB (ref 150–400)
RBC: 5.41 MIL/uL (ref 4.22–5.81)
RDW: 12.9 % (ref 11.5–15.5)
WBC: 7.1 10*3/uL (ref 4.0–10.5)

## 2016-04-24 LAB — COMPREHENSIVE METABOLIC PANEL
ALT: 60 U/L (ref 17–63)
ANION GAP: 8 (ref 5–15)
AST: 30 U/L (ref 15–41)
Albumin: 4.5 g/dL (ref 3.5–5.0)
Alkaline Phosphatase: 86 U/L (ref 38–126)
BILIRUBIN TOTAL: 0.6 mg/dL (ref 0.3–1.2)
BUN: 16 mg/dL (ref 6–20)
CHLORIDE: 106 mmol/L (ref 101–111)
CO2: 24 mmol/L (ref 22–32)
Calcium: 9 mg/dL (ref 8.9–10.3)
Creatinine, Ser: 0.98 mg/dL (ref 0.61–1.24)
Glucose, Bld: 96 mg/dL (ref 65–99)
Potassium: 3.9 mmol/L (ref 3.5–5.1)
Sodium: 138 mmol/L (ref 135–145)
TOTAL PROTEIN: 7.1 g/dL (ref 6.5–8.1)

## 2016-04-24 LAB — RAPID HIV SCREEN (HIV 1/2 AB+AG)
HIV 1/2 Antibodies: NONREACTIVE
HIV-1 P24 ANTIGEN - HIV24: NONREACTIVE

## 2016-04-24 NOTE — Assessment & Plan Note (Signed)
S/P EGD/Colonoscopy by Dr. Oneida Alar on 03/20/2016.  Based upon results, GI recommendations are as such:  1. EGD in 2021- 2023 (due to Barrett's esophagus)  2. Colonoscopy in Feb 2019 (due to 3 adenomas and 1 with low-grade dysplasia  3. Sisters, brothers, children, and parents recommended to have a colonoscopy starting at age 58 and then every 5 years.

## 2016-04-24 NOTE — Patient Instructions (Signed)
[place] at [facility] Discharge Instructions  RECOMMENDATIONS MADE BY THE CONSULTANT AND ANY TEST RESULTS WILL BE SENT TO YOUR REFERRING PHYSICIAN.  Thank you for choosing [facility] for your low platelet count. We will perform blood work today. Return in 4 weeks for follow-up.  Thank you for choosing [place] at [facility] to provide your oncology and hematology care.  To afford each patient quality time with our provider, please arrive at least 15 minutes before your scheduled appointment time.    If you have a lab appointment with the [facility] please come in thru the  Main Entrance and check in at the main information desk  You need to re-schedule your appointment should you arrive 10 or more minutes late.  We strive to give you quality time with our providers, and arriving late affects you and other patients whose appointments are after yours.  Also, if you no show three or more times for appointments you may be dismissed from the clinic at the providers discretion.     Again, thank you for choosing [facility].  Our hope is that these requests will decrease the amount of time that you wait before being seen by our physicians.       _____________________________________________________________  Should you have questions after your visit to [facility], please contact our office at [phone] between the hours of 8:30 a.m. and 4:30 p.m.  Voicemails left after 4:30 p.m. will not be returned until the following business day.  For prescription refill requests, have your pharmacy contact our office.       Resources For Cancer Patients and their Caregivers ? American Cancer Society: Can assist with transportation, wigs, general needs, runs Look Good Feel Better.        629-241-1102 ? Cancer Care: Provides financial assistance, online support groups, medication/co-pay assistance.   1-800-813-HOPE 704 377 6213) ? [place] Assists [place] cancer patients and their families through emotional , educational and financial support.  339-447-6979 ? [place] DSS Where to apply for food stamps, Medicaid and utility assistance. 321-020-3882 ? [facility]: Transportation to medical appointments. 765-250-4783 ? Social Security Administration: May apply for disability if have a Stage IV cancer. 671-713-0615 (906)003-2130 ? [employer], Disability and Transit Services: Assists with nutrition, care and transit needs. [place] Support Programs: @10RELATIVEDAYS @ > Cancer Support Group  2nd Tuesday of the month 1pm-2pm, Journey Room  > Creative Journey  3rd Tuesday of the month 1130am-1pm, Journey Room  > Look Good Feel Better  1st Wednesday of the month 10am-12 noon, Journey Room (Call [place] to register [phone])

## 2016-04-24 NOTE — Progress Notes (Signed)
[facility] Hematology/Oncology Consultation   Name: Edwin Silva      MRN: [id number]    Location: Room/bed info not found  Date: 04/24/2016 Time:5:20 PM   REFERRING PHYSICIAN:  Barney Drain, MD (GI)  REASON FOR CONSULT:  Thrombocytopenia   DIAGNOSIS:  Single documented instance of thrombocytopenia with normal WBC and HGB/RBC/HCT.  HISTORY OF PRESENT ILLNESS:   Edwin Silva is a 56 y.o. male with a medical history significant for Barrett's esophagus, GERD, fatty infiltration of liver who is referred to the [facility] for a single documented low platelet count.  He reports learning of his low platelet count a little while back.  He notes that his primary care provider advised him of this lab abnormality.  He underwent EGD/Colonoscopy by Dr. Oneida Alar on 03/20/2016 demonstrates a peptic stricture, esophageal and gastric polyp, hiatal hernia, moderate gastritis and mild duodenitis with grade I esophageal varices in the middle third of esophagus, multiple colon polyps.  Pathology was negative for malignancy, but adenomas were noted.  He was subsequently referred to hematology by GI for thrombocytopenia.  The patient denies any bleeding complaints, including blood in stool, black tarry stool, hematuria, gingival bleeding, epistaxis, hematemesis, hemoptysis.  He denies any new rashes.  He denies any abnormal bruising or bleeding.  He is asymptomatic from a hematologic standpoint.  He denies any lumps or bumps.  He denies any B symptoms.  He denies any unintentional weight loss.  Appetite is good.  He has a a significant amount of misinformation regarding hematology and what his low platelet count means.  He is provided education regarding platelets, their physiologic role, and different causes of thrombocytopenia.  He continues to work full time.  Review of Systems  Constitutional: Negative.  Negative for chills, fever and weight loss.  HENT:  Negative.   Eyes: Negative.   Respiratory: Negative.  Negative for cough.   Cardiovascular: Negative.  Negative for chest pain.  Gastrointestinal: Negative.  Negative for blood in stool, constipation, diarrhea, melena, nausea and vomiting.  Genitourinary: Negative.   Musculoskeletal: Negative.   Skin: Negative.   Neurological: Negative.  Negative for weakness.  Endo/Heme/Allergies: Negative.   Psychiatric/Behavioral: Negative.      PAST MEDICAL HISTORY:   Past Medical History:  Diagnosis Date  . GERD (gastroesophageal reflux disease)   . Obesity (BMI 30-39.9)   . Special screening for malignant neoplasms, colon 03/01/2016  . Thrombocytopenia ([place]) 03/01/2016    ALLERGIES: No Known Allergies    MEDICATIONS: I have reviewed the patient's current medications.    Current Outpatient Prescriptions on File Prior to Visit  Medication Sig Dispense Refill  . ibuprofen (ADVIL,MOTRIN) 200 MG tablet Take 800 mg by mouth every 6 (six) hours as needed for mild pain or moderate pain.      No current facility-administered medications on file prior to visit.      PAST SURGICAL HISTORY Past Surgical History:  Procedure Laterality Date  . BIOPSY  03/20/2016   Procedure: BIOPSY;  Surgeon: Danie Binder, MD;  [facility];  Service: Endoscopy;;  gastric  . COLONOSCOPY N/A 03/20/2016   Procedure: COLONOSCOPY;  Surgeon: Danie Binder, MD;  [facility];  Service: Endoscopy;  Laterality: N/A;  100  . ESOPHAGOGASTRODUODENOSCOPY N/A 03/20/2016   Procedure: ESOPHAGOGASTRODUODENOSCOPY (EGD);  Surgeon: Danie Binder, MD;  Location: [facility];  Service: Endoscopy;  Laterality: N/A;  . INNER EAR SURGERY Left   .  LASIK Bilateral   . MULTIPLE TOOTH EXTRACTIONS     AGE 44  . POLYPECTOMY  03/20/2016   Procedure: POLYPECTOMY;  Surgeon: Danie Binder, MD;  Location: [facility];  Service: Endoscopy;;  multiple colon gastric polyp esophageal nodule  . SHOULDER SURGERY Right     . SHOULDER SURGERY Left    SHAVED BONE SPUR  . SURGERY, KNEE Left     FAMILY HISTORY: Family History  Problem Relation Age of Onset  . Colon polyps Brother    His mother is deceased at the age of 23 from CPD complications. Father is deceased at the age of 73 from complications associated with Alzheimer's disease He has 1 brother and 1 sister, both living. He has 2 children, both sons, ages 64 and 53.  30 yo was born with tetralogy of fallot.   He has 1 granddaughter who is healthy.  SOCIAL HISTORY:  reports that he quit smoking about 14 years ago. He has a 75.00 pack-year smoking history. He has quit using smokeless tobacco. He reports that he does not drink alcohol or use drugs.  He is married x 34 years.  His wife's name is Barbaraann Share.  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  Social History   Social History  . Marital status: Married    Spouse name: N/A  . Number of children: 2  . Years of education: [place]   Occupational History  . CONSTRUCTION    Social History Main Topics  . Smoking status: Former Smoker    Packs/day: 3.00    Years: 25.00    Quit date: 03/07/2002  . Smokeless tobacco: Former Systems developer     Comment: quit 03/07/02  . Alcohol use No     Comment: Former EtOH abuse  . Drug use: No  . Sexual activity: Not Asked   Other Topics Concern  . None   Social History Narrative   [employer] 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 THIS COMPANY BEEN THERE FOR 5 YRS.    PERFORMANCE STATUS: The patient's performance status is 0 - Asymptomatic  PHYSICAL EXAM: Most Recent Vital Signs: Blood pressure (!) 146/88, pulse 65, temperature 98.5 F (36.9 C), temperature source Oral, resp. rate 16, height 6' 2.5" (1.892 m), weight 249 lb (112.9 kg), SpO2 99 %. BP (!) 146/88 (BP Location: Left Arm, Patient Position: Sitting)   Pulse 65   Temp 98.5 F (36.9 C) (Oral)   Resp 16   Ht 6' 2.5" (1.892 m)   Wt 249 lb (112.9 kg)   SpO2 99%   BMI 31.54 kg/m    General Appearance:    Alert, cooperative, no distress, appears stated age, accompanied by his wife, Barbaraann Share.  Head:    Normocephalic, without obvious abnormality, atraumatic  Eyes:    Conjunctiva/corneas clear, EOM's intact, both eyes       Ears:    Normal TM's and external ear canals, both ears  Nose:   Nares normal, septum midline, mucosa normal, no drainage    or sinus tenderness  Throat:   Lips, mucosa, and tongue normal; edentulous with upper and lower dentures.  Neck:   Supple, symmetrical, trachea midline, no adenopathy;       thyroid:  No enlargement/tenderness/nodules.  Back:     Symmetric, no curvature, ROM normal, no CVA tenderness  Lungs:     Clear to auscultation bilaterally, respirations unlabored  Chest wall:    No tenderness or deformity  Heart:  Regular rate and rhythm, S1 and S2 normal, no murmur, rub   or gallop  Abdomen:     Soft, non-tender, bowel sounds active all four quadrants,    no masses, no organomegaly  Genitalia:    Not examined  Rectal:    Not examined  Extremities:   Extremities normal, atraumatic, no cyanosis or edema, no petechiae  Pulses:   2+ and symmetric all extremities  Skin:   Skin color, texture, turgor normal, no rashes or lesions.  No petechiae  Lymph nodes:   Cervical, supraclavicular, and axillary nodes normal  Neurologic:   CNII-XII intact. Normal strength, sensation and reflexes      throughout    LABORATORY DATA:  CBC    Component Value Date/Time   WBC 7.1 04/24/2016 1252   RBC 5.41 04/24/2016 1252   HGB 16.5 04/24/2016 1252   HCT 47.5 04/24/2016 1252   PLT 127 (L) 04/24/2016 1252   MCV 87.8 04/24/2016 1252   MCH 30.5 04/24/2016 1252   MCHC 34.7 04/24/2016 1252   RDW 12.9 04/24/2016 1252   LYMPHSABS 1.1 04/24/2016 1252   MONOABS 0.6 04/24/2016 1252   EOSABS 0.2 04/24/2016 1252   BASOSABS 0.0 04/24/2016 1252     Chemistry      Component Value Date/Time   NA 138 04/24/2016 1252   K 3.9 04/24/2016 1252   CL 106  04/24/2016 1252   CO2 24 04/24/2016 1252   BUN 16 04/24/2016 1252   CREATININE 0.98 04/24/2016 1252      Component Value Date/Time   CALCIUM 9.0 04/24/2016 1252   ALKPHOS 86 04/24/2016 1252   AST 30 04/24/2016 1252   ALT 60 04/24/2016 1252   BILITOT 0.6 04/24/2016 1252       RADIOGRAPHY:  CLINICAL DATA:  Thrombocytopenia newly diagnosed, history GERD, former smoker, obesity  EXAM: ABDOMEN ULTRASOUND COMPLETE  COMPARISON:  None  FINDINGS: Gallbladder: Normally distended without stones or wall thickening. No pericholecystic fluid or sonographic Murphy sign.  Common bile duct: Diameter: 5 mm diameter, normal  Liver: Echogenic, likely fatty infiltration, though this can be seen with cirrhosis and certain infiltrative disorders. No focal hepatic mass or nodularity grossly identified though assessment of intrahepatic detail is limited due to sound attenuation. Hepatopetal portal venous flow.  IVC: Inadequately visualized due to poor sound attenuation through echogenic liver.  Pancreas: Obscured by bowel gas  Spleen: 10.2 cm length, normal appearance  Right Kidney: Length: 12.6 cm. Normal morphology without mass or hydronephrosis.  Left Kidney: Length: 12.5 cm. Normal morphology without mass or hydronephrosis.  Abdominal aorta: Proximal and distal portions obscured by bowel gas, visualized mid portion normal caliber  Other findings: No free fluid  IMPRESSION: Probable fatty infiltration of liver as above.  Inadequate visualization of IVC, pancreas, portions of aorta and portions of the liver as above.  If better intra-abdominal visualization is required, recommend CT with IV and oral contrast.   Electronically Signed   By: Lavonia Dana M.D.   On: 03/08/2016 09:06       PATHOLOGY:    Diagnosis 1. Colon, polyp(s), ascending/hepatic flexure - TUBULAR ADENOMA (X3 FRAGMENTS). - SESSILE SERRATED POLYP WITH LOW GRADE DYSPLASIA (X1  FRAGMENT). - SESSILE SERRATED POLYP WITHOUT DYSPLASIA (X5 FRAGMENTS). - NO HIGH GRADE DYSPLASIA OR MALIGNANCY. 2. Colon, polyp(s), splenic flexure - TUBULAR ADENOMA (X2 FRAGMENTS). - NO HIGH GRADE DYSPLASIA OR MALIGNANCY. 3. Colon, polyp(s), sigmoid - TUBULAR ADENOMA (X3 FRAGMENTS). - NO HIGH GRADE DYSPLASIA OR MALIGNANCY. 4. Rectum, polyp(s) -  HYPERPLASTIC POLYP. - NO DYSPLASIA OR MALIGNANCY. 5. Stomach, biopsy - CHRONIC GASTRITIS WITH INTESTINAL METAPLASIA. - NEGATIVE FOR HELICOBACTER PYLORI. - NO DYSPLASIA OR MALIGNANCY. 6. Stomach, polyp(s) - HYPERPLASTIC POLYP. - NO INTESTINAL METAPLASIA, DYSPLASIA, OR MALIGNANCY. 7. Esophagus, biopsy, nodule - INTESTINAL METAPLASIA (GOBLET CELL METAPLASIA) CONSISTENT WITH BARRETT'S ESOPHAGUS. - NO DYSPLASIA OR MALIGNANCY.  ASSESSMENT/PLAN:   Thrombocytopenia (HCC) Single documented instance of thrombocytopenia with normal WBC and HGB/RBC/HCT with fatty infiltration of liver identified on [place] of abdomen in Feb 2018.  Degrees of thrombocytopenia can be divided into mild (platelet count 100,000 to 150,000/microL), moderate (50,000 to 99,000/microL), and severe (<50,000/microL). Severe thrombocytopenia confers a greater risk of bleeding, but the correlation between platelet count and bleeding risk varies according to the underlying condition and may be unpredictable.  Asymptomatic, incidental findings, mild thrombocytopenia is commonly caused by immune thrombocytopenia (ITP), occult liver disease, HIV infection, and myelodysplastic syndromes.  Congenital thrombocytopenic conditions, sometimes misdiagnosed as ITP, may also occur.  In a patient with incidentally discovered asymptomatic thrombocytopenia and a probable diagnosis of ITP, no further evaluation beyond routine history, physical examination, CBC, review of peripheral blood smear, testing for HIV and Hepatitis C is necessary.  Anti-platelet antibody studies are not routinely done and imaging  studies and bone marrow aspiration and biopsy are not necessary unless other abnormalities are present.  The natural history of asymptomatic, mild thrombocytopenia was studied prospectively in 217 apparently healthy individuals referred to a hematology center for incidentally discovered platelet counts between 100,000 and 150,000/microL [27]. At six months of observation, 23 (11 percent) had normal platelet counts, two developed a myelodysplastic syndrome (refractory anemia), and one developed systemic lupus erythematosus. The remaining 191 individuals (88 percent) had persistent platelet counts <150,000/microL during this period without other signs of disease becoming evident; after five years, most (11 percent) had spontaneous resolution or persistent mild thrombocytopenia without development of an associated condition, supporting a diagnosis of ITP.  Labs today: CBC diff, CMET, peripheral smear review, HIV screen (HIV 1/2 Ab+Ag), hepatitis C antibody [in addition to HCV RNA testing in those who have a greater likelihood of false negative antibody testing (ie severely immunocompromised or hemodialysis patients or those suspected of having acute hepatitis C)], iron/TIBC, ferritin, vitamin B12, folate, serum copper.  Will also add SPEP+IFE to screen for multiple myeloma as well (although very unlikely).  I personally reviewed and went over radiographic studies with the patient.  The results are noted within this dictation.  [place] of abdomen performed in Feb 2018 demonstrated fatty infiltration of liver and normal spleen size.  No indication for further imaging at this time.  If hepatitis C antibody is positive, then Hepatitis C RNA testing is indicated.  If HCV RNA is detected, the diagnosis of HCV infection is confirmed.  If HCV RNA is not detected, then a reactive antibody likely represents either a past HCV infection that subsequently was cleared of a false-positive antibody test.  If HIV 1/2 immunoassay  is positive, then HIV-1/HIV-2 antibody differentiation immunoassay is necessary to determine type of HIV infection.  If negative or indeterminate, then HIV RNA testing is needed to determine acute HIV1 infection versus negative for HIV.  Return in 4-6 weeks for follow-up.  Special screening for malignant neoplasms, colon S/P EGD/Colonoscopy by Dr. Oneida Alar on 03/20/2016.  Based upon results, GI recommendations are as such:  1. EGD in 2021- 2023 (due to Barrett's esophagus)  2. Colonoscopy in Feb 2019 (due to 3 adenomas and 1 with low-grade dysplasia  3.  Sisters, brothers, children, and parents recommended to have a colonoscopy starting at age 34 and then every 5 years.   ORDERS PLACED FOR THIS ENCOUNTER: Orders Placed This Encounter  Procedures  . CBC with Differential  . Comprehensive metabolic panel  . Pathologist smear review  . Vitamin B12  . Folate  . Iron and TIBC  . Ferritin  . Copper, serum  . Rapid HIV screen (HIV 1/2 Ab+Ag)  . Hepatitis C Antibody  . Kappa/lambda light chains  . IgG, IgA, IgM  . Immunofixation electrophoresis  . Protein electrophoresis, serum    MEDICATIONS PRESCRIBED THIS ENCOUNTER: No orders of the defined types were placed in this encounter.   All questions were answered. The patient knows to call the clinic with any problems, questions or concerns. We can certainly see the patient much sooner if necessary.  Patient discussed with Dr. Talbert Cage and together we ascertained an up-to-date interval history, and examined the patient.  Dr. Talbert Cage developed the patient's assessment and plan.  This was a shared visit-consultation.  Her attestation will follow below.  This note is electronically signed by: Doy Mince 04/24/2016 5:20 PM

## 2016-04-24 NOTE — Assessment & Plan Note (Addendum)
Single documented instance of thrombocytopenia with normal WBC and HGB/RBC/HCT with fatty infiltration of liver identified on [place] of abdomen in Feb 2018.  Degrees of thrombocytopenia can be divided into mild (platelet count 100,000 to 150,000/microL), moderate (50,000 to 99,000/microL), and severe (<50,000/microL). Severe thrombocytopenia confers a greater risk of bleeding, but the correlation between platelet count and bleeding risk varies according to the underlying condition and may be unpredictable.  Asymptomatic, incidental findings, mild thrombocytopenia is commonly caused by immune thrombocytopenia (ITP), occult liver disease, HIV infection, and myelodysplastic syndromes.  Congenital thrombocytopenic conditions, sometimes misdiagnosed as ITP, may also occur.  In a patient with incidentally discovered asymptomatic thrombocytopenia and a probable diagnosis of ITP, no further evaluation beyond routine history, physical examination, CBC, review of peripheral blood smear, testing for HIV and Hepatitis C is necessary.  Anti-platelet antibody studies are not routinely done and imaging studies and bone marrow aspiration and biopsy are not necessary unless other abnormalities are present.  The natural history of asymptomatic, mild thrombocytopenia was studied prospectively in 217 apparently healthy individuals referred to a hematology center for incidentally discovered platelet counts between 100,000 and 150,000/microL [27]. At six months of observation, 23 (11 percent) had normal platelet counts, two developed a myelodysplastic syndrome (refractory anemia), and one developed systemic lupus erythematosus. The remaining 191 individuals (88 percent) had persistent platelet counts <150,000/microL during this period without other signs of disease becoming evident; after five years, most (73 percent) had spontaneous resolution or persistent mild thrombocytopenia without development of an associated condition,  supporting a diagnosis of ITP.  Labs today: CBC diff, CMET, peripheral smear review, HIV screen (HIV 1/2 Ab+Ag), hepatitis C antibody [in addition to HCV RNA testing in those who have a greater likelihood of false negative antibody testing (ie severely immunocompromised or hemodialysis patients or those suspected of having acute hepatitis C)], iron/TIBC, ferritin, vitamin B12, folate, serum copper.  Will also add SPEP+IFE to screen for multiple myeloma as well (although very unlikely).  I personally reviewed and went over radiographic studies with the patient.  The results are noted within this dictation.  [place] of abdomen performed in Feb 2018 demonstrated fatty infiltration of liver and normal spleen size.  No indication for further imaging at this time.  If hepatitis C antibody is positive, then Hepatitis C RNA testing is indicated.  If HCV RNA is detected, the diagnosis of HCV infection is confirmed.  If HCV RNA is not detected, then a reactive antibody likely represents either a past HCV infection that subsequently was cleared of a false-positive antibody test.  If HIV 1/2 immunoassay is positive, then HIV-1/HIV-2 antibody differentiation immunoassay is necessary to determine type of HIV infection.  If negative or indeterminate, then HIV RNA testing is needed to determine acute HIV1 infection versus negative for HIV.  Return in 4-6 weeks for follow-up.

## 2016-04-25 LAB — IRON AND TIBC
Iron: 56 ug/dL (ref 45–182)
Saturation Ratios: 16 % — ABNORMAL LOW (ref 17.9–39.5)
TIBC: 350 ug/dL (ref 250–450)
UIBC: 294 ug/dL

## 2016-04-25 LAB — PROTEIN ELECTROPHORESIS, SERUM
A/G Ratio: 1.6 (ref 0.7–1.7)
Albumin ELP: 4 g/dL (ref 2.9–4.4)
Alpha-1-Globulin: 0.2 g/dL (ref 0.0–0.4)
Alpha-2-Globulin: 0.6 g/dL (ref 0.4–1.0)
BETA GLOBULIN: 1 g/dL (ref 0.7–1.3)
GAMMA GLOBULIN: 0.8 g/dL (ref 0.4–1.8)
Globulin, Total: 2.5 g/dL (ref 2.2–3.9)
TOTAL PROTEIN ELP: 6.5 g/dL (ref 6.0–8.5)

## 2016-04-25 LAB — IGG, IGA, IGM
IGA: 84 mg/dL — AB (ref 90–386)
IGG (IMMUNOGLOBIN G), SERUM: 637 mg/dL — AB (ref 700–1600)
IGM, SERUM: 54 mg/dL (ref 20–172)

## 2016-04-25 LAB — VITAMIN B12: VITAMIN B 12: 288 pg/mL (ref 180–914)

## 2016-04-25 LAB — KAPPA/LAMBDA LIGHT CHAINS
KAPPA, LAMDA LIGHT CHAIN RATIO: 0.84 (ref 0.26–1.65)
Kappa free light chain: 8.7 mg/L (ref 3.3–19.4)
LAMDA FREE LIGHT CHAINS: 10.3 mg/L (ref 5.7–26.3)

## 2016-04-25 LAB — FERRITIN: FERRITIN: 54 ng/mL (ref 24–336)

## 2016-04-25 LAB — PATHOLOGIST SMEAR REVIEW

## 2016-04-25 LAB — HEPATITIS C ANTIBODY: HCV Ab: <0.1 s/co ratio (ref 0.0–0.9)

## 2016-04-25 LAB — FOLATE: FOLATE: 17 ng/mL (ref >5.9)

## 2016-04-26 LAB — IMMUNOFIXATION ELECTROPHORESIS
IGA: 84 mg/dL — AB (ref 90–386)
IgG (Immunoglobin G), Serum: 698 mg/dL — ABNORMAL LOW (ref 700–1600)
IgM, Serum: 55 mg/dL (ref 20–172)
Total Protein ELP: 6.6 g/dL (ref 6.0–8.5)

## 2016-04-26 LAB — COPPER, SERUM: COPPER: 99 ug/dL (ref 72–166)

## 2016-05-25 ENCOUNTER — Encounter (HOSPITAL_COMMUNITY): Payer: Self-pay | Admitting: Oncology

## 2016-05-25 ENCOUNTER — Encounter (HOSPITAL_COMMUNITY): Payer: BLUE CROSS/BLUE SHIELD | Attending: Oncology | Admitting: Oncology

## 2016-05-25 VITALS — BP 169/83 | HR 87 | Temp 99.1°F | Resp 16 | Wt 247.9 lb

## 2016-05-25 DIAGNOSIS — D696 Thrombocytopenia, unspecified: Secondary | ICD-10-CM | POA: Insufficient documentation

## 2016-05-25 DIAGNOSIS — E538 Deficiency of other specified B group vitamins: Secondary | ICD-10-CM

## 2016-05-25 MED ORDER — VITAMIN B-12 1000 MCG PO TABS: 1000.0000 ug | ORAL_TABLET | Freq: Every day | ORAL | 1 refills | Status: DC

## 2016-05-25 NOTE — Progress Notes (Signed)
[facility] Hematology/Oncology Progress Note  Name: Edwin Silva      MRN: [id number]    Location: Room/bed info not found  Date: 05/25/2016 Time:2:14 PM   REFERRING PHYSICIAN:  Barney Drain, MD (GI)  REASON FOR CONSULT:  Thrombocytopenia   DIAGNOSIS:  Single documented instance of thrombocytopenia with normal WBC and HGB/RBC/HCT.  HISTORY OF PRESENT ILLNESS:   Edwin Silva is a 56 y.o. male who returns for follow up of a single documented low platelet count.  He is doing well today. Denies chest pain, SOB, abdominal pain, bleeding/bruising or any other concerns.   Review of Systems  Constitutional: Negative.   HENT: Negative.   Eyes: Negative.   Respiratory: Negative.  Negative for shortness of breath.   Cardiovascular: Negative.  Negative for chest pain.  Gastrointestinal: Negative.  Negative for abdominal pain.  Genitourinary: Negative.   Musculoskeletal: Negative.   Skin: Negative.   Neurological: Negative.   Endo/Heme/Allergies: Negative.   Psychiatric/Behavioral: Negative.   All other systems reviewed and are negative. 14 point review of systems was performed and is negative except as detailed under history of present illness and above  PAST MEDICAL HISTORY:   Past Medical History:  Diagnosis Date  . GERD (gastroesophageal reflux disease)   . Obesity (BMI 30-39.9)   . Special screening for malignant neoplasms, colon 03/01/2016  . Thrombocytopenia ([place]) 03/01/2016    ALLERGIES: No Known Allergies    MEDICATIONS: I have reviewed the patient's current medications.    Current Outpatient Prescriptions on File Prior to Visit  Medication Sig Dispense Refill  . ibuprofen (ADVIL,MOTRIN) 200 MG tablet Take 800 mg by mouth every 6 (six) hours as needed for mild pain or moderate pain.      No current facility-administered medications on file prior to visit.      PAST SURGICAL HISTORY Past Surgical History:  Procedure Laterality Date   . BIOPSY  03/20/2016   Procedure: BIOPSY;  Surgeon: Danie Binder, MD;  [facility];  Service: Endoscopy;;  gastric  . COLONOSCOPY N/A 03/20/2016   Procedure: COLONOSCOPY;  Surgeon: Danie Binder, MD;  [facility];  Service: Endoscopy;  Laterality: N/A;  100  . ESOPHAGOGASTRODUODENOSCOPY N/A 03/20/2016   Procedure: ESOPHAGOGASTRODUODENOSCOPY (EGD);  Surgeon: Danie Binder, MD;  [facility];  Service: Endoscopy;  Laterality: N/A;  . INNER EAR SURGERY Left   . LASIK Bilateral   . MULTIPLE TOOTH EXTRACTIONS     AGE 41  . POLYPECTOMY  03/20/2016   Procedure: POLYPECTOMY;  Surgeon: Danie Binder, MD;  [facility];  Service: Endoscopy;;  multiple colon gastric polyp esophageal nodule  . SHOULDER SURGERY Right   . SHOULDER SURGERY Left    SHAVED BONE SPUR  . SURGERY, KNEE Left     FAMILY HISTORY: Family History  Problem Relation Age of Onset  . Colon polyps Brother    His mother is deceased at the age of 58 from CPD complications. Father is deceased at the age of 22 from complications associated with Alzheimer's disease He has 1 brother and 1 sister, both living. He has 2 children, both sons, ages 55 and 75.  65 yo was born with tetralogy of fallot.   He has 1 granddaughter who is healthy.  SOCIAL HISTORY:  reports that he quit smoking about 14 years ago. He has a 75.00 pack-year smoking history. He has quit using smokeless tobacco. He reports that he  does not drink alcohol or use drugs.  He is married x 34 years.  His wife's name is Barbaraann Share.  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  Social History   Social History  . Marital status: Married    Spouse name: N/A  . Number of children: 2  . Years of education: [place]   Occupational History  . CONSTRUCTION    Social History Main Topics  . Smoking status: Former Smoker    Packs/day: 3.00    Years: 25.00    Quit date: 03/07/2002  . Smokeless tobacco: Former Systems developer      Comment: quit 03/07/02  . Alcohol use No     Comment: Former EtOH abuse  . Drug use: No  . Sexual activity: Not on file   Other Topics Concern  . Not on file   Social History Narrative   [employer] 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 THIS COMPANY BEEN THERE FOR 5 YRS.    PERFORMANCE STATUS: The patient's performance status is 0 - Asymptomatic  PHYSICAL EXAM: Vitals:   05/25/16 1424  BP: (!) 169/83  Pulse: 87  Resp: 16  Temp: 99.1 F (37.3 C)    Physical Exam  Constitutional: He is oriented to person, place, and time and well-developed, well-nourished, and in no distress.  HENT:  Head: Normocephalic and atraumatic.  Mouth/Throat: Oropharynx is clear and moist.  Eyes: Conjunctivae and EOM are normal. Pupils are equal, round, and reactive to light.  Neck: Normal range of motion. Neck supple.  Cardiovascular: Normal rate, regular rhythm and normal heart sounds.   Pulmonary/Chest: Effort normal and breath sounds normal.  Abdominal: Soft. Bowel sounds are normal.  Musculoskeletal: Normal range of motion.  Neurological: He is alert and oriented to person, place, and time. Gait normal.  Skin: Skin is warm and dry.  Nursing note and vitals reviewed.  LABORATORY DATA:  CBC    Component Value Date/Time   WBC 7.1 04/24/2016 1252   RBC 5.41 04/24/2016 1252   HGB 16.5 04/24/2016 1252   HCT 47.5 04/24/2016 1252   PLT 127 (L) 04/24/2016 1252   MCV 87.8 04/24/2016 1252   MCH 30.5 04/24/2016 1252   MCHC 34.7 04/24/2016 1252   RDW 12.9 04/24/2016 1252   LYMPHSABS 1.1 04/24/2016 1252   MONOABS 0.6 04/24/2016 1252   EOSABS 0.2 04/24/2016 1252   BASOSABS 0.0 04/24/2016 1252     Chemistry      Component Value Date/Time   NA 138 04/24/2016 1252   K 3.9 04/24/2016 1252   CL 106 04/24/2016 1252   CO2 24 04/24/2016 1252   BUN 16 04/24/2016 1252   CREATININE 0.98 04/24/2016 1252      Component Value Date/Time   CALCIUM 9.0 04/24/2016 1252   ALKPHOS 86 04/24/2016  1252   AST 30 04/24/2016 1252   ALT 60 04/24/2016 1252   BILITOT 0.6 04/24/2016 1252         RADIOGRAPHY: I have personally reviewed the radiological images as listed and agreed with the findings in the report.  US Abdomen 03/08/2016 IMPRESSION: Probable fatty infiltration of liver as above.  Inadequate visualization of IVC, pancreas, portions of aorta and portions of the liver as above.  If better intra-abdominal visualization is required, recommend CT with IV and oral contrast.   PATHOLOGY:      Results for TYREZ, BERRIOS (MRN [id number]) as of 05/25/2016 14:06  Ref. Range 03/01/2016 16:09 04/24/2016 12:52  WBC Latest  Ref Range: 4.0 - 10.5 K/uL 7.0 7.1  RBC Latest Ref Range: 4.22 - 5.81 MIL/uL 5.42 5.41  Hemoglobin Latest Ref Range: 13.0 - 17.0 g/dL 16.4 16.5  HCT Latest Ref Range: 39.0 - 52.0 % 48.8 47.5  MCV Latest Ref Range: 78.0 - 100.0 fL 90.0 87.8  MCH Latest Ref Range: 26.0 - 34.0 pg 30.3 30.5  MCHC Latest Ref Range: 30.0 - 36.0 g/dL 33.6 34.7  RDW Latest Ref Range: 11.5 - 15.5 % 14.1 12.9  Platelets Latest Ref Range: 150 - 400 K/uL 122 (L) 127 (L)  Copper, serum  Order: [id number]  Status:  Final result Visible to patient:  Yes (MyChart) Next appt:  07/25/2016 at 03:50 PM in Oncology ([facility]) Dx:  Thrombocytopenia (HCC)   Ref Range & Units 81mo ago  Copper 72 - 166 ug/dL 99        Vitamin B-12 180 - 914 pg/mL 288   Comments: (NOTE)  This assay is not validated for testing neonatal or  myeloproliferative syndrome specimens for Vitamin B12 levels.  Performed at [facility], [place] [address] [place], [place]  [address]   Resulting Agency  [facility]    Specimen Collected: 04/24/16 12:52 Last Resulted: 04/25/16 00:53                      Iron and TIBC  Order: [id number]   Status:  Final result Visible to patient:  Yes (MyChart) Next appt:  07/25/2016 at 03:50 PM in Oncology ([facility]) Dx:  Thrombocytopenia ([place])  Notes recorded  by Rennie Natter, RN on 04/26/2016 at 9:40 AM EDT Patient called with resulted labs, informed that some are pending. He verbalized understanding. ------  Notes recorded by Baird Cancer, PA-C on 04/25/2016 at 5:53 PM EDT Labs are good. Only 2 more tests are pending.   Ref Range & Units 44mo ago  Iron 45 - 182 ug/dL 56   TIBC 250 - 450 ug/dL 350   Saturation Ratios 17.9 - 39.5 % 16    UIBC ug/dL 294        ASSESSMENT/PLAN:  Mild Thrombocytopenia (HCC)- improving on its own Mild Vitamin B12 deficiency  Reviewed patient's lab workup for his thrombocytopenia in detail with him today. His workup has been essentially negative. Reviewed labs including: CBC with Differential   . Comprehensive metabolic panel  . Pathologist smear review  . Vitamin B12  . Folate  . Iron and TIBC  . Ferritin  . Copper, serum  . Rapid HIV screen (HIV 1/2 Ab+Ag)  . Hepatitis C Antibody  . Kappa/lambda light chains  . IgG, IgA, IgM  . Immunofixation electrophoresis  . Protein electrophoresis, serum     The only abnormality found was a borderline low B12 level. I have prescribed him a daily oral B12 supplement.   Patient states that he cannot return for follow up in 2 months due to his extremely busy work schedule at this time. Therefore I told him to get labs done in 2 months, and call me to go over these results. If the labs are normal, I will discharge him to his PCP.   ORDERS PLACED FOR THIS ENCOUNTER: Orders Placed This Encounter  Procedures  . CBC with Differential    Standing Status:   Future    Standing Expiration Date:   05/25/2017  . Comprehensive metabolic panel    Standing Status:   Future    Standing Expiration Date:   05/25/2017  .  Vitamin B12    Standing Status:   Future    Standing Expiration Date:   05/25/2017     All questions were answered. The patient knows to call the clinic with any problems, questions or concerns. We can certainly see the patient much sooner if  necessary.  This document serves as a record of services personally performed by Twana First, MD. It was created on her behalf by Martinique Casey, a trained medical scribe. The creation of this record is based on the scribe's personal observations and the provider's statements to them. This document has been checked and approved by the attending provider.  I have reviewed the above documentation for accuracy and completeness and I agree with the above.  This note is electronically signed by: Martinique M Casey 05/25/2016 2:14 PM

## 2016-05-25 NOTE — Patient Instructions (Signed)
Edwin Silva at [facility] Discharge Instructions  RECOMMENDATIONS MADE BY THE CONSULTANT AND ANY TEST RESULTS WILL BE SENT TO YOUR REFERRING PHYSICIAN.  You were seen today by Dr. Twana First Have your lab work done in 2 months and we will call you to discuss the results   Thank you for choosing [facility] to provide your oncology and hematology care.  To afford each patient quality time with our provider, please arrive at least 15 minutes before your scheduled appointment time.    If you have a lab appointment with the [facility] please come in thru the  Main Entrance and check in at the main information desk  You need to re-schedule your appointment should you arrive 10 or more minutes late.  We strive to give you quality time with our providers, and arriving late affects you and other patients whose appointments are after yours.  Also, if you no show three or more times for appointments you may be dismissed from the clinic at the providers discretion.     Again, thank you for choosing [facility].  Our hope is that these requests will decrease the amount of time that you wait before being seen by our physicians.       _____________________________________________________________  Should you have questions after your visit to [facility], please contact our office at [phone] between the hours of 8:30 a.m. and 4:30 p.m.  Voicemails left after 4:30 p.m. will not be returned until the following business day.  For prescription refill requests, have your pharmacy contact our office.       Resources For Cancer Patients and their Caregivers ? American Cancer Society: Can assist with transportation, wigs, general needs, runs Look Good Feel Better.        639-700-5444 ? Cancer Care: Provides financial assistance, online support groups, medication/co-pay assistance.  1-800-813-HOPE 7075144143) ? [place] Assists [place] cancer patients and their families through emotional , educational and financial support.  (831) 766-1662 ? [place] DSS Where to apply for food stamps, Medicaid and utility assistance. 670-638-6043 ? [facility]: Transportation to medical appointments. (702) 574-5368 ? Social Security Administration: May apply for disability if have a Stage IV cancer. (407)155-3392 254-030-1825 ? [employer], Disability and Transit Services: Assists with nutrition, care and transit needs. [place] Support Programs: @10RELATIVEDAYS @ > Cancer Support Group  2nd Tuesday of the month 1pm-2pm, Journey Room  > Creative Journey  3rd Tuesday of the month 1130am-1pm, Journey Room  > Look Good Feel Better  1st Wednesday of the month 10am-12 noon, Journey Room (Call [place] to register [phone])

## 2016-07-25 ENCOUNTER — Other Ambulatory Visit (HOSPITAL_COMMUNITY): Payer: BLUE CROSS/BLUE SHIELD

## 2016-07-27 ENCOUNTER — Encounter: Payer: Self-pay | Admitting: Gastroenterology

## 2017-02-22 ENCOUNTER — Encounter: Payer: Self-pay | Admitting: Gastroenterology

## 2018-07-12 IMAGING — US US ABDOMEN COMPLETE
1 series · 13 of 25 positions shown · non-contrast
Comparison: None

CLINICAL DATA: Thrombocytopenia newly diagnosed, history GERD,
former smoker, obesity

EXAM:
ABDOMEN ULTRASOUND COMPLETE

[Series 1: us abdomen complete · 0.22mm/px · 13 of 109 slices shown]
[im 1/109]
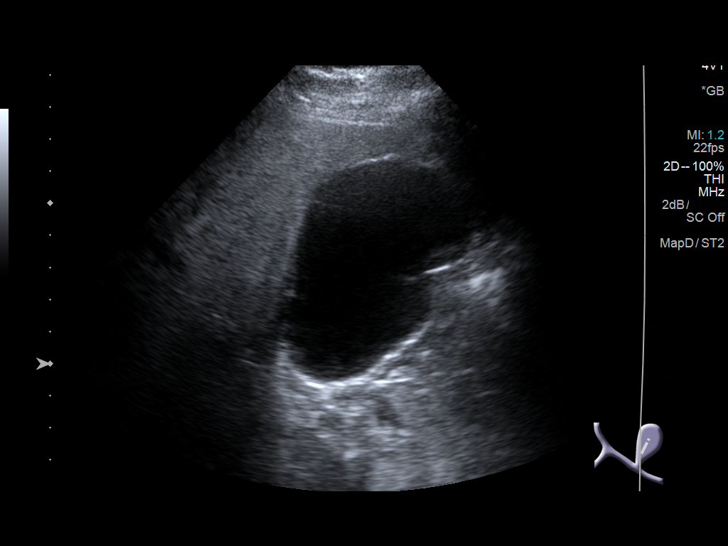
[im 10/109]
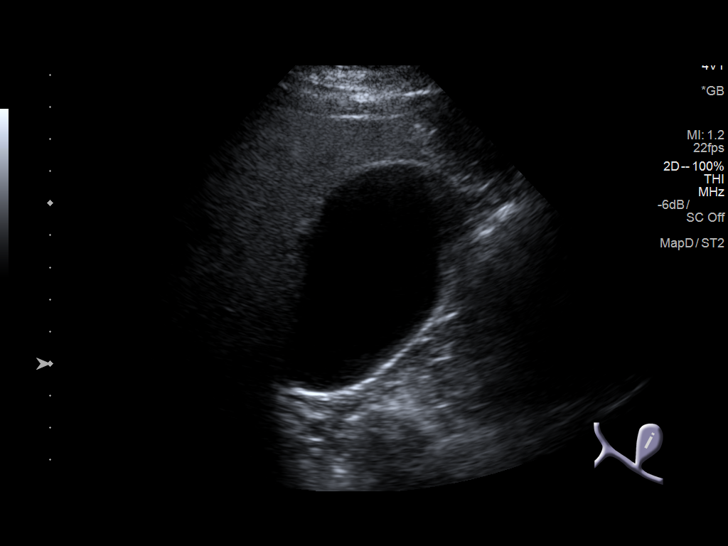
[im 19/109]
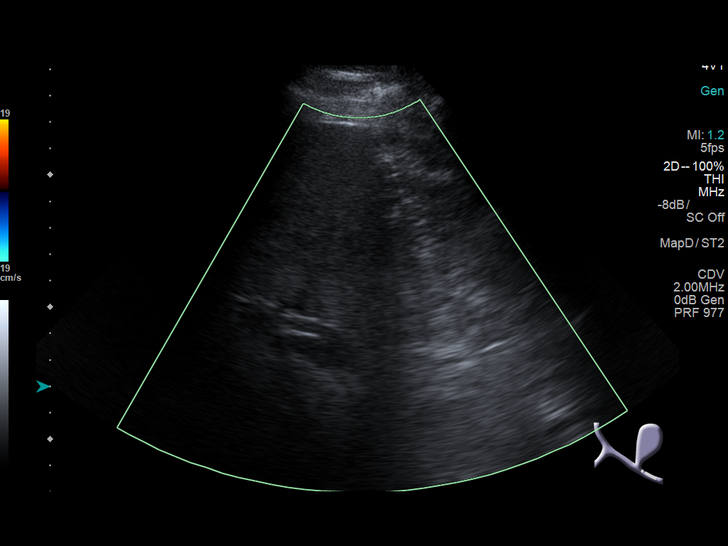
[im 28/109]
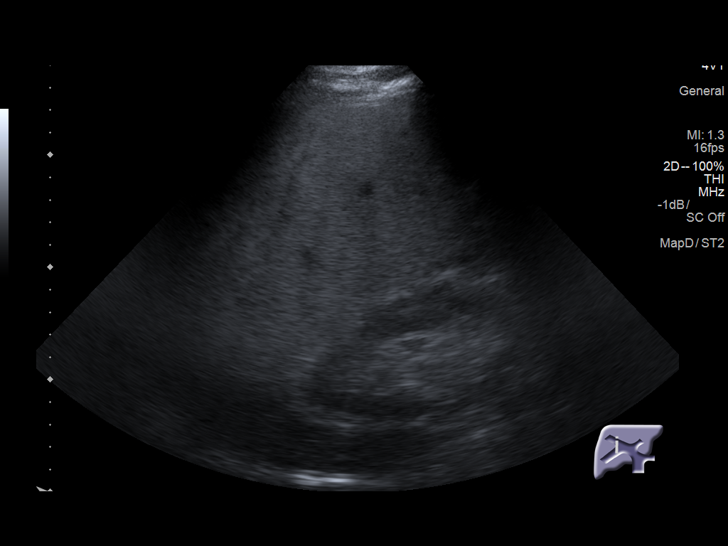
[im 37/109]
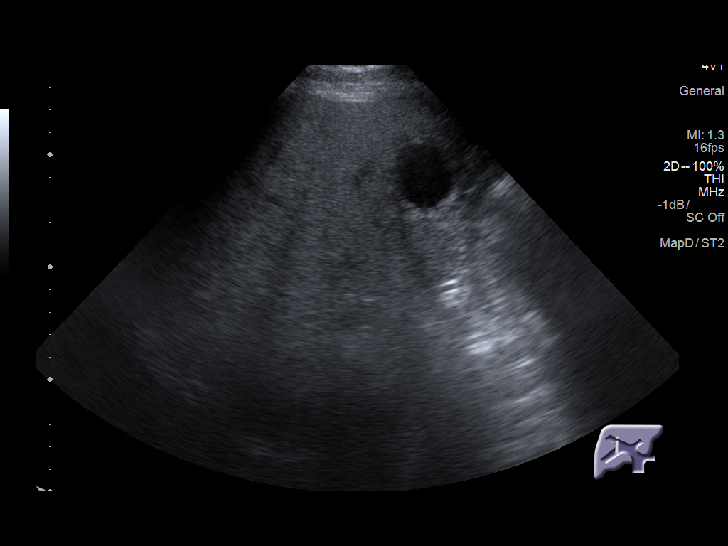
[im 46/109]
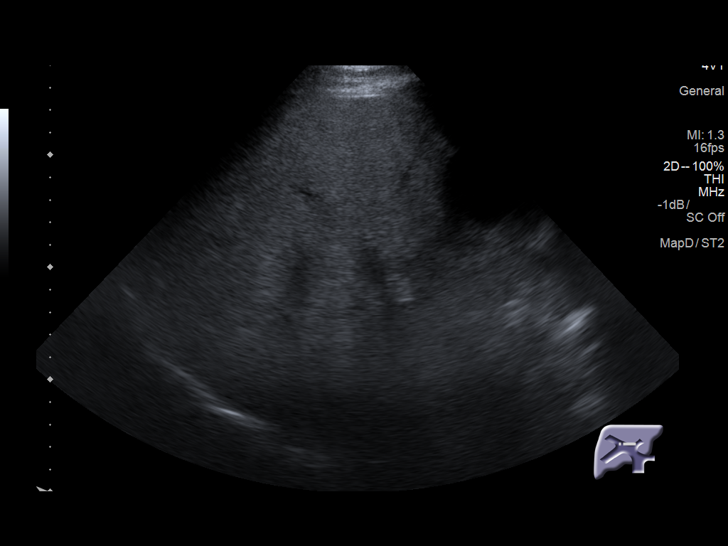
[im 55/109]
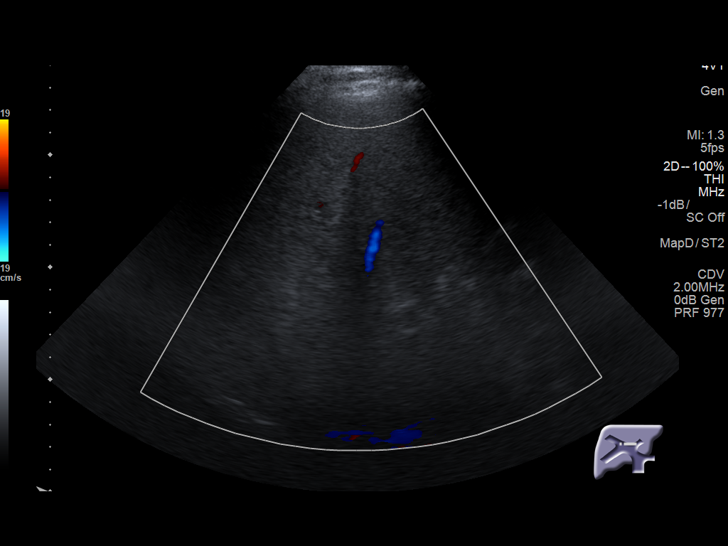
[im 64/109]
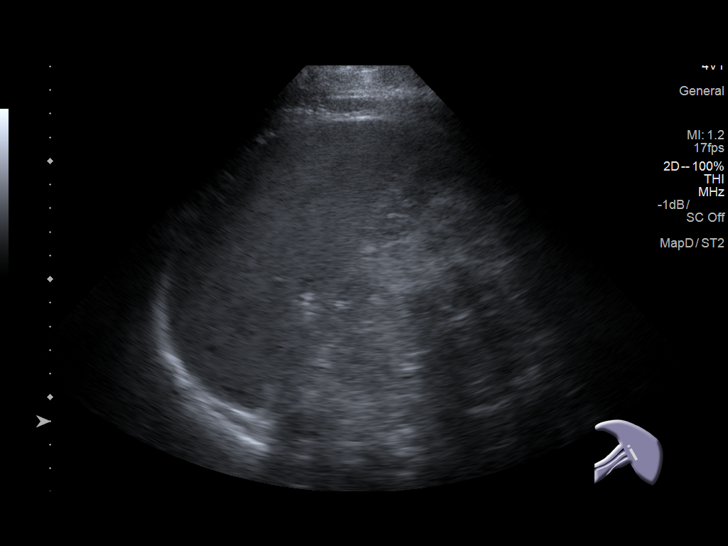
[im 73/109]
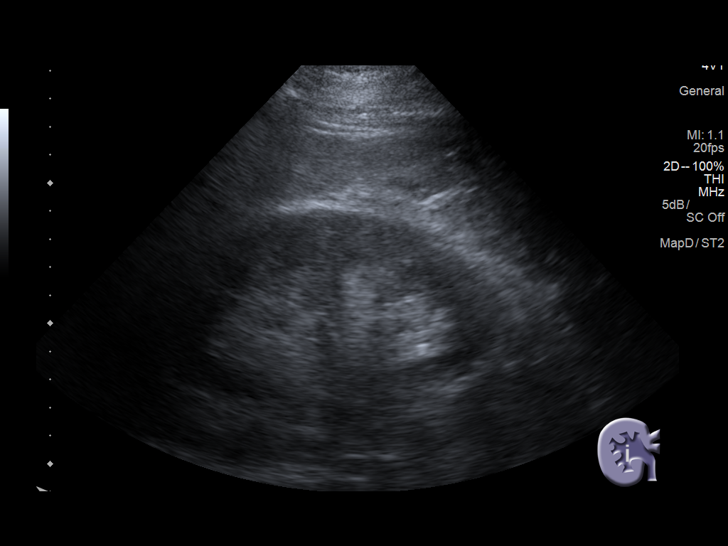
[im 82/109]
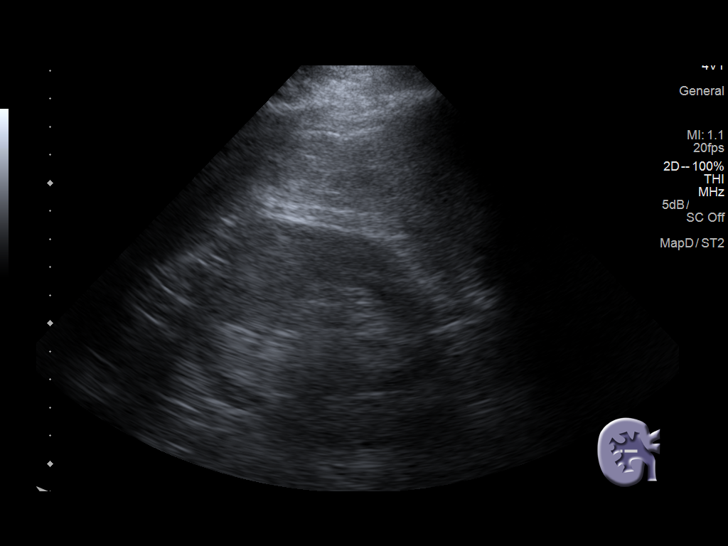
[im 91/109]
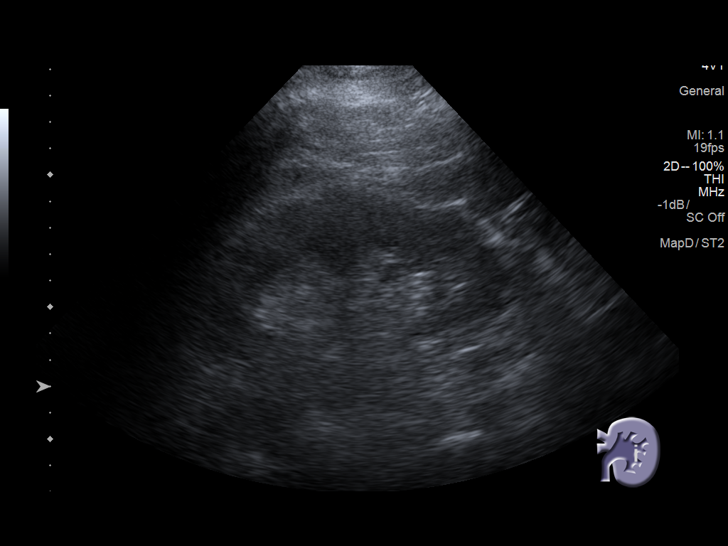
[im 100/109]
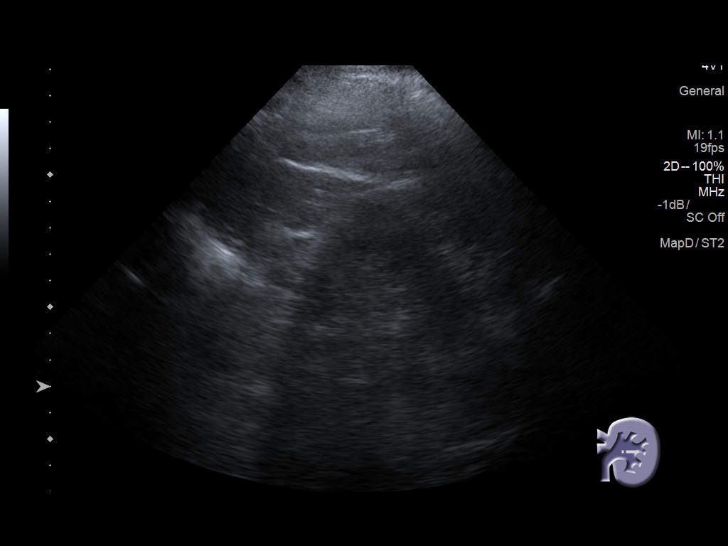
[im 109/109]
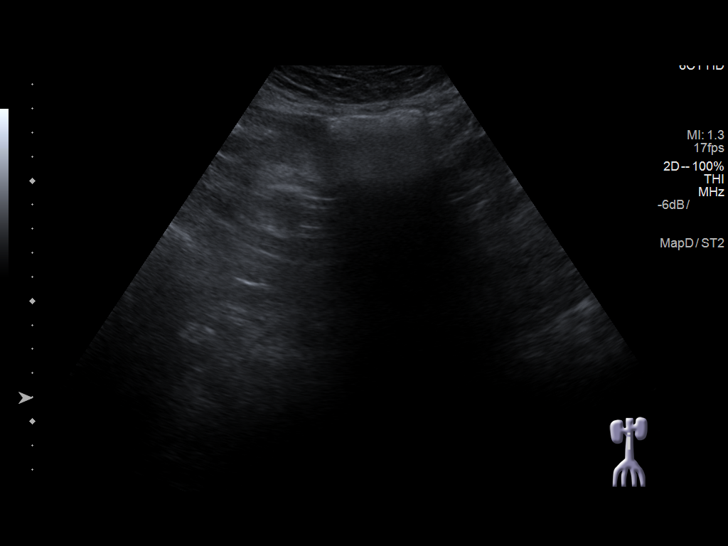

[13 of 25 positions shown; findings below may reference images not displayed]

FINDINGS: Gallbladder: Normally distended without stones or wall thickening.
No pericholecystic fluid or sonographic Murphy sign.

Common bile duct: Diameter: 5 mm diameter, normal

Liver: Echogenic, likely fatty infiltration, though this can be seen
with cirrhosis and certain infiltrative disorders. No focal hepatic
mass or nodularity grossly identified though assessment of
intrahepatic detail is limited due to sound attenuation. Hepatopetal
portal venous flow.

IVC: Inadequately visualized due to poor sound attenuation through
echogenic liver.

Pancreas: Obscured by bowel gas

Spleen: 10.2 cm length, normal appearance

Right Kidney: Length: 12.6 cm. Normal morphology without mass or
hydronephrosis.

Left Kidney: Length: 12.5 cm. Normal morphology without mass or
hydronephrosis.

Abdominal aorta: Proximal and distal portions obscured by bowel gas,
visualized mid portion normal caliber

Other findings: No free fluid
IMPRESSION: Probable fatty infiltration of liver as above.

Inadequate visualization of IVC, pancreas, portions of aorta and
portions of the liver as above.

If better intra-abdominal visualization is required, recommend CT
with IV and oral contrast.

## 2019-03-05 ENCOUNTER — Encounter: Payer: Self-pay | Admitting: Gastroenterology

## 2020-06-18 NOTE — Progress Notes (Signed)
MORNING OF SURGERY DRINK:   DRINK 1 G2 drink BEFORE YOU LEAVE HOME, DRINK ALL OF THE  G2 DRINK AT ONE TIME.   NO SOLID FOOD AFTER 600 PM THE NIGHT BEFORE YOUR SURGERY. YOU MAY DRINK CLEAR FLUIDS. THE G2 DRINK YOU DRINK BEFORE YOU LEAVE HOME WILL BE THE LAST FLUIDS YOU DRINK BEFORE SURGERY.  PAIN IS EXPECTED AFTER SURGERY AND WILL NOT BE COMPLETELY ELIMINATED. AMBULATION AND TYLENOL WILL HELP REDUCE INCISIONAL AND GAS PAIN. MOVEMENT IS KEY!  YOU ARE EXPECTED TO BE OUT OF BED WITHIN 4 HOURS OF ADMISSION TO YOUR PATIENT ROOM.  SITTING IN THE RECLINER THROUGHOUT THE DAY IS IMPORTANT FOR DRINKING FLUIDS AND MOVING GAS THROUGHOUT THE GI TRACT.  COMPRESSION STOCKINGS SHOULD BE WORN [place] UNLESS YOU ARE WALKING.   INCENTIVE SPIROMETER SHOULD BE USED EVERY HOUR WHILE AWAKE TO DECREASE POST-OPERATIVE COMPLICATIONS SUCH AS PNEUMONIA.  WHEN DISCHARGED HOME, IT IS IMPORTANT TO CONTINUE TO WALK EVERY HOUR AND USE THE INCENTIVE SPIROMETER EVERY HOUR.        DUE TO COVID-19 ONLY ONE VISITOR IS ALLOWED TO COME WITH YOU AND STAY IN THE WAITING ROOM ONLY DURING PRE OP AND PROCEDURE DAY OF SURGERY. THE 1 VISITOR  MAY VISIT WITH YOU AFTER SURGERY IN YOUR PRIVATE ROOM DURING VISITING HOURS ONLY!  YOU NEED TO HAVE A COVID 19 TEST ON_______ @_______ , THIS TEST MUST BE DONE BEFORE SURGERY,  COVID TESTING SITE [address] [place] [address], IT IS ON THE RIGHT GOING OUT [address] APPROXIMATELY  2 MINUTES PAST [employer] ON THE RIGHT. ONCE YOUR COVID TEST IS COMPLETED,  PLEASE BEGIN THE QUARANTINE INSTRUCTIONS AS OUTLINED IN YOUR HANDOUT.                Edwin Silva  06/18/2020   Your procedure is scheduled on:  07/01/2020   Report to [facility] Main  Entrance   Report to admitting at   [place] AM     Call this number if you have problems the morning of surgery [phone]    REMEMBER: NO  SOLID FOOD CANDY OR GUM AFTER MIDNIGHT. CLEAR LIQUIDS  UNTIL     0430am      . NOTHING BY MOUTH EXCEPT CLEAR LIQUIDS UNTIL   0430am     . PLEASE FINISH ENSURE DRINK PER SURGEON ORDER  WHICH NEEDS TO BE COMPLETED AT  0430am   .      CLEAR LIQUID DIET   Foods Allowed                                                                    Coffee and tea, regular and decaf                            Fruit ices (not with fruit pulp)                                      Iced Popsicles  Carbonated beverages, regular and diet                                    Cranberry, grape and apple juices Sports drinks like Gatorade Lightly seasoned clear broth or consume(fat free) Sugar, honey syrup ___________________________________________________________________      BRUSH YOUR TEETH MORNING OF SURGERY AND RINSE YOUR MOUTH OUT, NO CHEWING GUM CANDY OR MINTS.     Take these medicines the morning of surgery with A SIP OF WATER:      Flomax, prilosec DO NOT TAKE ANY DIABETIC MEDICATIONS DAY OF YOUR SURGERY                               You may not have any metal on your body including hair pins and              piercings  Do not wear jewelry, make-up, lotions, powders or perfumes, deodorant             Do not wear nail polish on your fingernails.  Do not shave  48 hours prior to surgery.              Men may shave face and neck.   Do not bring valuables to the hospital. [place].  Contacts, dentures or bridgework may not be worn into surgery.  Leave suitcase in the car. After surgery it may be brought to your room.     Patients discharged the day of surgery will not be allowed to drive home. IF YOU ARE HAVING SURGERY AND GOING HOME THE SAME DAY, YOU MUST HAVE AN ADULT TO DRIVE YOU HOME AND BE WITH YOU FOR 24 HOURS. YOU MAY GO HOME BY TAXI OR UBER OR ORTHERWISE, BUT AN ADULT MUST ACCOMPANY YOU HOME AND STAY WITH YOU FOR 24 HOURS.  Name and phone number of your  driver:  Special Instructions: N/A              Please read over the following fact sheets you were given: _____________________________________________________________________  [facility] - Preparing for Surgery Before surgery, you can play an important role.  Because skin is not sterile, your skin needs to be as free of germs as possible.  You can reduce the number of germs on your skin by washing with CHG (chlorahexidine gluconate) soap before surgery.  CHG is an antiseptic cleaner which kills germs and bonds with the skin to continue killing germs even after washing. Please DO NOT use if you have an allergy to CHG or antibacterial soaps.  If your skin becomes reddened/irritated stop using the CHG and inform your nurse when you arrive at Short Stay. Do not shave (including legs and underarms) for at least 48 hours prior to the first CHG shower.  You may shave your face/neck. Please follow these instructions carefully:  1.  Shower with CHG Soap the night before surgery and the  morning of Surgery.  2.  If you choose to wash your hair, wash your hair first as usual with your  normal  shampoo.  3.  After you shampoo, rinse your hair and body thoroughly to remove the  shampoo.  4.  Use CHG as you would any other liquid soap.  You can apply chg directly  to the skin and wash                       Gently with a scrungie or clean washcloth.  5.  Apply the CHG Soap to your body ONLY FROM THE NECK DOWN.   Do not use on face/ open                           Wound or open sores. Avoid contact with eyes, ears mouth and genitals (private parts).                       Wash face,  Genitals (private parts) with your normal soap.             6.  Wash thoroughly, paying special attention to the area where your surgery  will be performed.  7.  Thoroughly rinse your body with warm water from the neck down.  8.  DO NOT shower/wash with your normal soap after using and rinsing off  the CHG  Soap.                9.  Pat yourself dry with a clean towel.            10.  Wear clean pajamas.            11.  Place clean sheets on your bed the night of your first shower and do not  sleep with pets. Day of Surgery : Do not apply any lotions/deodorants the morning of surgery.  Please wear clean clothes to the hospital/surgery center.  FAILURE TO FOLLOW THESE INSTRUCTIONS MAY RESULT IN THE CANCELLATION OF YOUR SURGERY PATIENT SIGNATURE_________________________________  NURSE SIGNATURE__________________________________  ________________________________________________________________________             [facility]- Preparing for Total Shoulder Arthroplasty    Before surgery, you can play an important role. Because skin is not sterile, your skin needs to be as free of germs as possible. You can reduce the number of germs on your skin by using the following products. . Benzoyl Peroxide Gel o Reduces the number of germs present on the skin o Applied twice a day to shoulder area starting two days before surgery    ==================================================================  Please follow these instructions carefully:  BENZOYL PEROXIDE 5% GEL  Please do not use if you have an allergy to benzoyl peroxide.   If your skin becomes reddened/irritated stop using the benzoyl peroxide.  Starting two days before surgery, apply as follows: 1. Apply benzoyl peroxide in the morning and at night. Apply after taking a shower. If you are not taking a shower clean entire shoulder front, back, and side along with the armpit with a clean wet washcloth.  2. Place a quarter-sized dollop on your shoulder and rub in thoroughly, making sure to cover the front, back, and side of your shoulder, along with the armpit.   2 days before ____ AM   ____ PM              1 day before ____ AM   ____ PM                         3. Do this twice a day for two days.  (Last application is the  night before  surgery, AFTER using the CHG soap as described below).  4. Do NOT apply benzoyl peroxide gel on the day of surgery.

## 2020-06-24 ENCOUNTER — Encounter (HOSPITAL_COMMUNITY)
Admission: RE | Admit: 2020-06-24 | Discharge: 2020-06-24 | Disposition: A | Discharge status: Home / Self Care | Source: Ambulatory Visit | Attending: Orthopedic Surgery | Admitting: Orthopedic Surgery

## 2020-06-24 ENCOUNTER — Other Ambulatory Visit: Payer: Self-pay

## 2020-06-24 ENCOUNTER — Encounter (HOSPITAL_COMMUNITY): Payer: Self-pay

## 2020-06-24 DIAGNOSIS — Z01818 Encounter for other preprocedural examination: Secondary | ICD-10-CM | POA: Insufficient documentation

## 2020-06-24 HISTORY — DX: Essential (primary) hypertension: I10

## 2020-06-24 LAB — BASIC METABOLIC PANEL
Anion gap: 7 (ref 5–15)
BUN: 22 mg/dL (ref 8–23)
CO2: 26 mmol/L (ref 22–32)
Calcium: 9.1 mg/dL (ref 8.9–10.3)
Chloride: 105 mmol/L (ref 98–111)
Creatinine, Ser: 1.13 mg/dL (ref 0.61–1.24)
GFR, Estimated: >60 mL/min (ref >60)
Glucose, Bld: 102 mg/dL — ABNORMAL HIGH (ref 70–99)
Potassium: 4.5 mmol/L (ref 3.5–5.1)
Sodium: 138 mmol/L (ref 135–145)

## 2020-06-24 LAB — CBC
HCT: 43.6 % (ref 39.0–52.0)
Hemoglobin: 13.9 g/dL (ref 13.0–17.0)
MCH: 26.3 pg (ref 26.0–34.0)
MCHC: 31.9 g/dL (ref 30.0–36.0)
MCV: 82.4 fL (ref 80.0–100.0)
Platelets: 147 10*3/uL — ABNORMAL LOW (ref 150–400)
RBC: 5.29 MIL/uL (ref 4.22–5.81)
RDW: 15.3 % (ref 11.5–15.5)
WBC: 5.9 10*3/uL (ref 4.0–10.5)
nRBC: 0 % (ref 0.0–0.2)

## 2020-06-24 LAB — SURGICAL PCR SCREEN
MRSA, PCR: NEGATIVE
Staphylococcus aureus: NEGATIVE

## 2020-06-24 NOTE — Progress Notes (Signed)
Anesthesia Review:  PCP: DR patty chappell in [place]  Cardiologist : none  Chest x-ray : EKG : Echo : Stress test: Cardiac Cath :  Activity level: can do a flight of stairs without difficulty  Sleep Study/ CPAP : none  Fasting Blood Sugar :      / Checks Blood Sugar -- times a day:   Blood Thinner/ Instructions /Last Dose: ASA / Instructions/ Last Dose :

## 2020-07-01 ENCOUNTER — Encounter (HOSPITAL_COMMUNITY): Payer: Self-pay | Admitting: Orthopedic Surgery

## 2020-07-01 ENCOUNTER — Ambulatory Visit (HOSPITAL_COMMUNITY)
Admission: RE | Admit: 2020-07-01 | Discharge: 2020-07-01 | Disposition: A | Discharge status: Home / Self Care | Source: Ambulatory Visit | Attending: Orthopedic Surgery | Admitting: Orthopedic Surgery

## 2020-07-01 ENCOUNTER — Ambulatory Visit (HOSPITAL_COMMUNITY): Admitting: Physician Assistant

## 2020-07-01 ENCOUNTER — Ambulatory Visit (HOSPITAL_COMMUNITY): Admitting: Certified Registered Nurse Anesthetist

## 2020-07-01 ENCOUNTER — Encounter (HOSPITAL_COMMUNITY)
Admission: RE | Disposition: A | Discharge status: Home / Self Care | Payer: Self-pay | Source: Ambulatory Visit | Attending: Orthopedic Surgery

## 2020-07-01 DIAGNOSIS — M25712 Osteophyte, left shoulder: Secondary | ICD-10-CM | POA: Insufficient documentation

## 2020-07-01 DIAGNOSIS — Z6832 Body mass index (BMI) 32.0-32.9, adult: Secondary | ICD-10-CM | POA: Diagnosis not present

## 2020-07-01 DIAGNOSIS — M75102 Unspecified rotator cuff tear or rupture of left shoulder, not specified as traumatic: Secondary | ICD-10-CM | POA: Diagnosis not present

## 2020-07-01 DIAGNOSIS — Z79899 Other long term (current) drug therapy: Secondary | ICD-10-CM | POA: Insufficient documentation

## 2020-07-01 DIAGNOSIS — E669 Obesity, unspecified: Secondary | ICD-10-CM | POA: Insufficient documentation

## 2020-07-01 DIAGNOSIS — Z87891 Personal history of nicotine dependence: Secondary | ICD-10-CM | POA: Diagnosis not present

## 2020-07-01 HISTORY — PX: REVERSE SHOULDER ARTHROPLASTY: SHX5054

## 2020-07-01 SURGERY — ARTHROPLASTY, SHOULDER, TOTAL, REVERSE
Anesthesia: Regional | Site: Shoulder | Laterality: Left

## 2020-07-01 MED ORDER — VANCOMYCIN HCL 1000 MG IV SOLR
INTRAVENOUS | Status: DC | PRN
  Administered 2020-07-01: 1000 mg via TOPICAL

## 2020-07-01 MED ORDER — CYCLOBENZAPRINE HCL 10 MG PO TABS: 10.0000 mg | ORAL_TABLET | Freq: Three times a day (TID) | ORAL | 1 refills | Status: DC | PRN

## 2020-07-01 MED ORDER — NAPROXEN 500 MG PO TABS: 500.0000 mg | ORAL_TABLET | Freq: Two times a day (BID) | ORAL | 1 refills | Status: DC

## 2020-07-01 MED ORDER — ONDANSETRON HCL 4 MG PO TABS: 4.0000 mg | ORAL_TABLET | Freq: Three times a day (TID) | ORAL | 0 refills | Status: DC | PRN

## 2020-07-01 MED ORDER — OXYCODONE-ACETAMINOPHEN 5-325 MG PO TABS: 1.0000 | ORAL_TABLET | ORAL | 0 refills | Status: DC | PRN

## 2020-07-01 MED ORDER — BUPIVACAINE HCL (PF) 0.5 % IJ SOLN
INTRAMUSCULAR | Status: DC | PRN
  Administered 2020-07-01: 15 mL via PERINEURAL

## 2020-07-01 MED ORDER — MIDAZOLAM HCL 2 MG/2ML IJ SOLN
INTRAMUSCULAR | Status: AC
  Filled 2020-07-01: qty 2

## 2020-07-01 MED ORDER — METOCLOPRAMIDE HCL 5 MG/ML IJ SOLN: 5.0000 mg | Freq: Three times a day (TID) | INTRAMUSCULAR | Status: DC | PRN

## 2020-07-01 MED ORDER — PROPOFOL 10 MG/ML IV BOLUS
INTRAVENOUS | Status: AC
  Filled 2020-07-01: qty 20

## 2020-07-01 MED ORDER — TRANEXAMIC ACID-NACL 1000-0.7 MG/100ML-% IV SOLN
1000.0000 mg | INTRAVENOUS | Status: AC
  Administered 2020-07-01: 1000 mg via INTRAVENOUS
  Filled 2020-07-01: qty 100

## 2020-07-01 MED ORDER — LIDOCAINE 2% (20 MG/ML) 5 ML SYRINGE
INTRAMUSCULAR | Status: AC
  Filled 2020-07-01: qty 5

## 2020-07-01 MED ORDER — LACTATED RINGERS IV SOLN: INTRAVENOUS | Status: DC

## 2020-07-01 MED ORDER — ONDANSETRON HCL 4 MG/2ML IJ SOLN
INTRAMUSCULAR | Status: DC | PRN
  Administered 2020-07-01: 4 mg via INTRAVENOUS

## 2020-07-01 MED ORDER — METOCLOPRAMIDE HCL 5 MG/ML IJ SOLN
INTRAMUSCULAR | Status: AC
  Administered 2020-07-01: 10 mg via INTRAVENOUS
  Filled 2020-07-01: qty 2

## 2020-07-01 MED ORDER — ONDANSETRON HCL 4 MG/2ML IJ SOLN
INTRAMUSCULAR | Status: AC
  Administered 2020-07-01: 4 mg via INTRAVENOUS
  Filled 2020-07-01: qty 2

## 2020-07-01 MED ORDER — PHENYLEPHRINE 40 MCG/ML (10ML) SYRINGE FOR IV PUSH (FOR BLOOD PRESSURE SUPPORT)
PREFILLED_SYRINGE | INTRAVENOUS | Status: AC
  Filled 2020-07-01: qty 10

## 2020-07-01 MED ORDER — ONDANSETRON HCL 4 MG PO TABS: 4.0000 mg | ORAL_TABLET | Freq: Four times a day (QID) | ORAL | Status: DC | PRN

## 2020-07-01 MED ORDER — 0.9 % SODIUM CHLORIDE (POUR BTL) OPTIME
TOPICAL | Status: DC | PRN
  Administered 2020-07-01: 1000 mL

## 2020-07-01 MED ORDER — DEXAMETHASONE SODIUM PHOSPHATE 4 MG/ML IJ SOLN
INTRAMUSCULAR | Status: DC | PRN
  Administered 2020-07-01: 10 mg via INTRAVENOUS

## 2020-07-01 MED ORDER — BUPIVACAINE LIPOSOME 1.3 % IJ SUSP
INTRAMUSCULAR | Status: DC | PRN
  Administered 2020-07-01: 10 mL via PERINEURAL

## 2020-07-01 MED ORDER — LIDOCAINE 2% (20 MG/ML) 5 ML SYRINGE
INTRAMUSCULAR | Status: DC | PRN
  Administered 2020-07-01: 60 mg via INTRAVENOUS

## 2020-07-01 MED ORDER — MIDAZOLAM HCL 5 MG/5ML IJ SOLN
INTRAMUSCULAR | Status: DC | PRN
  Administered 2020-07-01 (×2): 1 mg via INTRAVENOUS

## 2020-07-01 MED ORDER — STERILE WATER FOR IRRIGATION IR SOLN
Status: DC | PRN
  Administered 2020-07-01: 2000 mL

## 2020-07-01 MED ORDER — GLYCOPYRROLATE PF 0.2 MG/ML IJ SOSY
PREFILLED_SYRINGE | INTRAMUSCULAR | Status: AC
  Filled 2020-07-01: qty 1

## 2020-07-01 MED ORDER — LACTATED RINGERS IV BOLUS
250.0000 mL | Freq: Once | INTRAVENOUS | Status: AC
  Administered 2020-07-01: 250 mL via INTRAVENOUS

## 2020-07-01 MED ORDER — PHENYLEPHRINE 40 MCG/ML (10ML) SYRINGE FOR IV PUSH (FOR BLOOD PRESSURE SUPPORT)
PREFILLED_SYRINGE | INTRAVENOUS | Status: DC | PRN
  Administered 2020-07-01: 80 ug via INTRAVENOUS

## 2020-07-01 MED ORDER — ACETAMINOPHEN 500 MG PO TABS
1000.0000 mg | ORAL_TABLET | Freq: Once | ORAL | Status: AC
  Administered 2020-07-01: 1000 mg via ORAL
  Filled 2020-07-01: qty 2

## 2020-07-01 MED ORDER — DEXAMETHASONE SODIUM PHOSPHATE 10 MG/ML IJ SOLN
INTRAMUSCULAR | Status: AC
  Filled 2020-07-01: qty 1

## 2020-07-01 MED ORDER — ONDANSETRON HCL 4 MG/2ML IJ SOLN: 4.0000 mg | Freq: Four times a day (QID) | INTRAMUSCULAR | Status: DC | PRN

## 2020-07-01 MED ORDER — CHLORHEXIDINE GLUCONATE 0.12 % MT SOLN
15.0000 mL | Freq: Once | OROMUCOSAL | Status: AC
  Administered 2020-07-01: 15 mL via OROMUCOSAL

## 2020-07-01 MED ORDER — ORAL CARE MOUTH RINSE: 15.0000 mL | Freq: Once | OROMUCOSAL | Status: AC

## 2020-07-01 MED ORDER — CEFAZOLIN SODIUM-DEXTROSE 2-4 GM/100ML-% IV SOLN
2.0000 g | INTRAVENOUS | Status: AC
  Administered 2020-07-01: 2 g via INTRAVENOUS
  Filled 2020-07-01: qty 100

## 2020-07-01 MED ORDER — PROPOFOL 10 MG/ML IV BOLUS
INTRAVENOUS | Status: DC | PRN
  Administered 2020-07-01: 150 mg via INTRAVENOUS

## 2020-07-01 MED ORDER — ROCURONIUM BROMIDE 10 MG/ML (PF) SYRINGE
PREFILLED_SYRINGE | INTRAVENOUS | Status: DC | PRN
  Administered 2020-07-01: 100 mg via INTRAVENOUS

## 2020-07-01 MED ORDER — METOCLOPRAMIDE HCL 5 MG PO TABS: 5.0000 mg | ORAL_TABLET | Freq: Three times a day (TID) | ORAL | Status: DC | PRN

## 2020-07-01 MED ORDER — ROCURONIUM BROMIDE 10 MG/ML (PF) SYRINGE
PREFILLED_SYRINGE | INTRAVENOUS | Status: AC
  Filled 2020-07-01: qty 10

## 2020-07-01 MED ORDER — SUGAMMADEX SODIUM 200 MG/2ML IV SOLN
INTRAVENOUS | Status: DC | PRN
  Administered 2020-07-01: 200 mg via INTRAVENOUS

## 2020-07-01 MED ORDER — VANCOMYCIN HCL 1000 MG IV SOLR
INTRAVENOUS | Status: AC
  Filled 2020-07-01: qty 1000

## 2020-07-01 MED ORDER — LACTATED RINGERS IV BOLUS
500.0000 mL | Freq: Once | INTRAVENOUS | Status: AC
  Administered 2020-07-01: 500 mL via INTRAVENOUS

## 2020-07-01 MED ORDER — FENTANYL CITRATE (PF) 100 MCG/2ML IJ SOLN: 25.0000 ug | INTRAMUSCULAR | Status: DC | PRN

## 2020-07-01 MED ORDER — ONDANSETRON HCL 4 MG/2ML IJ SOLN
INTRAMUSCULAR | Status: AC
  Filled 2020-07-01: qty 2

## 2020-07-01 MED ORDER — FENTANYL CITRATE (PF) 100 MCG/2ML IJ SOLN
INTRAMUSCULAR | Status: AC
  Filled 2020-07-01: qty 2

## 2020-07-01 MED ORDER — FENTANYL CITRATE (PF) 100 MCG/2ML IJ SOLN
INTRAMUSCULAR | Status: DC | PRN
  Administered 2020-07-01 (×2): 50 ug via INTRAVENOUS

## 2020-07-01 SURGICAL SUPPLY — 65 items
BAG ZIPLOCK 12X15 (MISCELLANEOUS) ×2 IMPLANT
BLADE SAW SGTL 83.5X18.5 (BLADE) ×2 IMPLANT
COOLER ICEMAN CLASSIC (MISCELLANEOUS) ×2 IMPLANT
COVER BACK TABLE 60X90IN (DRAPES) ×2 IMPLANT
COVER SURGICAL LIGHT HANDLE (MISCELLANEOUS) ×2 IMPLANT
COVER WAND RF STERILE (DRAPES) IMPLANT
CUP SUT UNIV REVERS 42 NEUT (Shoulder) ×2 IMPLANT
DERMABOND ADVANCED (GAUZE/BANDAGES/DRESSINGS) ×1
DERMABOND ADVANCED .7 DNX12 (GAUZE/BANDAGES/DRESSINGS) ×1 IMPLANT
DRAPE INCISE IOBAN 66X45 STRL (DRAPES) IMPLANT
DRAPE ORTHO SPLIT 77X108 STRL (DRAPES) ×2
DRAPE SHEET LG 3/4 BI-LAMINATE (DRAPES) ×2 IMPLANT
DRAPE SURG 17X11 SM STRL (DRAPES) ×2 IMPLANT
DRAPE SURG ORHT 6 SPLT 77X108 (DRAPES) ×2 IMPLANT
DRAPE U-SHAPE 47X51 STRL (DRAPES) ×2 IMPLANT
DRESSING AQUACEL AG SP 3.5X6 (GAUZE/BANDAGES/DRESSINGS) IMPLANT
DRSG AQUACEL AG ADV 3.5X10 (GAUZE/BANDAGES/DRESSINGS) ×4 IMPLANT
DRSG AQUACEL AG SP 3.5X6 (GAUZE/BANDAGES/DRESSINGS)
DURAPREP 26ML APPLICATOR (WOUND CARE) ×2 IMPLANT
ELECT BLADE TIP CTD 4 INCH (ELECTRODE) ×2 IMPLANT
ELECT REM PT RETURN 15FT ADLT (MISCELLANEOUS) ×2 IMPLANT
FACESHIELD WRAPAROUND (MASK) ×8 IMPLANT
GLENOID SYS 42 +4 LAT/24 SHLDR (Miscellaneous) ×2 IMPLANT
GLENOID UNI REV MOD 24 +2 LAT (Joint) ×2 IMPLANT
GLOVE SRG 8 PF TXTR STRL LF DI (GLOVE) ×1 IMPLANT
GLOVE SURG ENC MOIS LTX SZ7 (GLOVE) ×2 IMPLANT
GLOVE SURG ENC MOIS LTX SZ7.5 (GLOVE) ×2 IMPLANT
GLOVE SURG UNDER POLY LF SZ7 (GLOVE) ×2 IMPLANT
GLOVE SURG UNDER POLY LF SZ8 (GLOVE) ×1
GOWN STRL REUS W/TWL LRG LVL3 (GOWN DISPOSABLE) ×4 IMPLANT
INSERT HUMERAL LRG 42/+6 (Miscellaneous) ×2 IMPLANT
KIT BASIN OR (CUSTOM PROCEDURE TRAY) ×2 IMPLANT
KIT TURNOVER KIT A (KITS) ×2 IMPLANT
MANIFOLD NEPTUNE II (INSTRUMENTS) ×2 IMPLANT
NEEDLE TAPERED W/ NITINOL LOOP (MISCELLANEOUS) ×2 IMPLANT
NS IRRIG 1000ML POUR BTL (IV SOLUTION) ×2 IMPLANT
PACK SHOULDER (CUSTOM PROCEDURE TRAY) ×2 IMPLANT
PAD ARMBOARD 7.5X6 YLW CONV (MISCELLANEOUS) ×2 IMPLANT
PAD COLD SHLDR WRAP-ON (PAD) ×2 IMPLANT
PIN NITINOL TARGETER 2.8 (PIN) IMPLANT
PIN SET MODULAR GLENOID SYSTEM (PIN) ×2 IMPLANT
RESTRAINT HEAD UNIVERSAL NS (MISCELLANEOUS) ×2 IMPLANT
SCREW CENTRAL MOD 30MM (Screw) ×2 IMPLANT
SCREW PERIPHERAL 5.5X20 LOCK (Screw) ×4 IMPLANT
SCREW PERIPHERAL 5.5X28 LOCK (Screw) ×2 IMPLANT
SCREW PERIPHERAL 5.5X40 LOCK (Screw) ×2 IMPLANT
SLING ARM FOAM STRAP LRG (SOFTGOODS) IMPLANT
SLING ARM FOAM STRAP MED (SOFTGOODS) IMPLANT
SLING ARM FOAM STRAP XLG (SOFTGOODS) ×2 IMPLANT
SPONGE LAP 18X18 RF (DISPOSABLE) IMPLANT
STEM HUMERAL UNI REVERSE SZ10 (Stem) ×2 IMPLANT
SUCTION FRAZIER HANDLE 12FR (TUBING)
SUCTION TUBE FRAZIER 12FR DISP (TUBING) IMPLANT
SUT FIBERWIRE #2 38 T-5 BLUE (SUTURE)
SUT MNCRL AB 3-0 PS2 18 (SUTURE) ×2 IMPLANT
SUT MON AB 2-0 CT1 36 (SUTURE) ×2 IMPLANT
SUT VIC AB 1 CT1 36 (SUTURE) ×2 IMPLANT
SUTURE FIBERWR #2 38 T-5 BLUE (SUTURE) IMPLANT
SUTURE TAPE 1.3 40 TPR END (SUTURE) ×2 IMPLANT
SUTURETAPE 1.3 40 TPR END (SUTURE) ×4
TOWEL OR 17X26 10 PK STRL BLUE (TOWEL DISPOSABLE) ×2 IMPLANT
TOWEL OR NON WOVEN STRL DISP B (DISPOSABLE) ×2 IMPLANT
WATER STERILE IRR 1000ML POUR (IV SOLUTION) ×4 IMPLANT
YANKAUER SUCT BULB TIP 10FT TU (MISCELLANEOUS) ×2 IMPLANT
YANKAUER SUCT BULB TIP NO VENT (SUCTIONS) ×2 IMPLANT

## 2020-07-01 NOTE — Op Note (Signed)
07/01/2020  9:18 AM  PATIENT:   Edwin Silva  60 y.o. male  PRE-OPERATIVE DIAGNOSIS:  Left shoulder rotator cuff tear arthropathy  POST-OPERATIVE DIAGNOSIS: Same  PROCEDURE: Left shoulder reverse arthroplasty utilizing a size 10 Arthrex stem with a +6 polyethylene insert, neutral metaphysis, 42/+4 glenosphere on a small/+2 baseplate  SURGEON:  Raeli Wiens, Metta Clines M.D.  ASSISTANTS: Jenetta Loges, PA-C  ANESTHESIA:   General endotracheal and interscalene block with Exparel  EBL: 200 cc  SPECIMEN: None  Drains: None   PATIENT DISPOSITION:  PACU - hemodynamically stable.    PLAN OF CARE: Discharge to home after PACU  Brief history:  Edwin Silva is a 60 year old gentleman who has had chronic and progressively increasing left shoulder pain related to severe rotator cuff tear arthropathy.  He had a previous arthroscopic attempted repair of the rotator cuff which unfortunately was found to be an irreparable tear.  He now presents with significantly increased pain as well as functional rotations which are impacting his quality of life.  His symptoms and failure to respond to prolonged attempts at conservative management and he is brought to the operating room at this time for planned left shoulder reverse arthroplasty.  Preoperatively, I counseled the patient regarding treatment options and risks versus benefits thereof.  Possible surgical complications were all reviewed including potential for bleeding, infection, neurovascular injury, persistent pain, loss of motion, anesthetic complication, failure of the implant, and possible need for additional surgery. They understand and accept and agrees with our planned procedure.   Procedure in detail:  After undergoing routine preop evaluation the patient received prophylactic antibiotics and interscalene block with Exparel was established in the holding area by the anesthesia department.  Patient subsequently placed supine on the  operating table and underwent the smooth induction of a general endotracheal anesthesia.  Placed into the beachchair position and appropriately padded and protected.  The left shoulder girdle region was sterilely prepped and draped in standard fashion.  Timeout was called.  A deltopectoral approach left shoulder is made through a 10 cm incision.  Skin flaps were elevated and dissection carried deeply and the deltopectoral interval was then developed from proximal to distal with the vein taken laterally.  The conjoined tendon was mobilized and retracted medially and the upper centimeter half the pectoralis major tendon was tenotomized for exposure.  The long head bicep tendon was then tenodesed at the upper border the pectoralis major tendon with the proximal segment then unroofed and excised.  The rotator cuff was then split along the rotator interval to the base of the coracoid and the subscapularis was then separated from the lesser tuberosity with electrocautery and the free margin was tagged with a pair of suture tape sutures.  A small superior remnant of the rotator cuff was divided and this allowed deliver the humeral head to the wound with capsular attachment dividing the anterior and infra margins of the humeral neck.  Extra medullary guide was then used to outline a proposed humeral head resection which was then performed with an oscillating saw at approximate 20 degrees retroversion.  Peripheral osteophytes removed with rondure and a metal cap was then placed over the cut proximal humeral surface after this was sized at a large/42.  At this point the glenoid was then exposed and a circumferential labral resection was completed.  Guidepin was then directed into the center of the glenoid with an approximately 10 degree inferior tilt and the glenoid was then reamed with appropriate central and peripheral reamers to  a stable subchondral bony bed.  All peripheral debris was then carefully removed in preparation  for central drill and tap had a 30 mm lag screw.  Our baseplate was then assembled and vancomycin powder was applied to the threads of the lag screw the baseplate was then inserted with excellent purchase and fixation and all the peripheral locking screws were then placed with excellent purchase and fixation.  A 42/+4 glenosphere was then impacted onto the baseplate and the central locking screw was placed.  Attention then returned to the proximal humerus where the canal was opened and we ultimately broached up to a size 10 and approximately 20 degrees retroversion.  A neutral metaphyseal reamer was then used to prepare the metaphysis.  A trial implant was then placed which showed good motion good stability good soft tissue balance.  This point the trial was removed and the final implant was then assembled.  Vancomycin powder was then spread liberally into the humeral canal and the final implant was then impacted into position with excellent purchase and fixation.  A series of trial reduction was then performed and ultimately the +6 probably give [place] the best soft tissue balance with good motion good stability.  The trial polyp was removed the implant was then cleaned and dried the final polywas then impacted and final reduction was performed showing again excellent motion good stability good soft tissue balance.  This time the subscapularis was confirmed to have good mobility and elasticity and was then repaired back to the eyelets on the collar the implant using the previously placed suture tape sutures.  Final irrigation was then completed.  The arm easily achieved 45 degrees of external rotation without excessive tension on the subscap repair.  The balance of the vancomycin powder was then spread liberally throughout the deep soft tissue layers.  The deltopectoral interval was reapproximated with a series of figure-of-eight and 1 Vicryl sutures.  2-0 Monocryl used to the subcu layer and intracuticular 3-0  Monocryl for the skin followed by Dermabond and Aquacel dressing.  Left arm was then placed into a sling.  The patient was awakened, extubated, and taken to the recovery room in stable condition.  Jenetta Loges, PA-C was utilized as an Environmental consultant throughout this case, essential for help with positioning the patient, positioning extremity, tissue manipulation, implantation of the prosthesis, suture management, wound closure, and intraoperative decision-making.  Marin Shutter MD   Contact # [phone]

## 2020-07-01 NOTE — Evaluation (Signed)
Occupational Therapy Evaluation Patient Details Name: Edwin Silva MRN: [id number] DOB: [birth year] Today's Date: 07/01/2020    History of Present Illness Patient is a 60 year old male s/p L reverse total shoulder arthroplasty.   Clinical Impression   Patient is a 60 year old male s/p shoulder replacement without functional use of left non-dominant upper extremity secondary to effects of surgery and interscalene block and shoulder precautions. Therapist provided education and instruction to patient and spouse in regards to exercises, precautions, positioning, donning upper extremity clothing and bathing while maintaining shoulder precautions, ice and edema management and donning/doffing sling. Patient and spouse verbalized understanding and demonstrated as needed. Patient needed assistance to donn shirt, underwear, pants, socks and shoes and provided with instruction on compensatory strategies to perform ADLs. Patient to follow up with MD for further therapy needs.      Follow Up Recommendations  Follow surgeon's recommendation for DC plan and follow-up therapies    Equipment Recommendations  None recommended by OT       Precautions / Restrictions Precautions Precautions: Shoulder Type of Shoulder Precautions: If sitting in controlled environment, ok to come out of sling to give neck a break. Please sleep in it to protect until follow up in office.     OK to use operative arm for feeding, hygiene and ADLs.   Ok to instruct Pendulums and lap slides as exercises. Ok to use operative arm within the following parameters for ADL purposes, Ok for PROM, AAROM, AROM within pain tolerance and within the following ROM   ER 20   ABD 45   FE 60, ok distal ROM elbow, wrist, hand Shoulder Interventions: Shoulder sling/immobilizer;Off for dressing/bathing/exercises Precaution Booklet Issued: Yes (comment) Required Braces or Orthoses: Sling Restrictions Weight Bearing Restrictions: Yes LUE Weight  Bearing: Non weight bearing      Mobility Bed Mobility               General bed mobility comments: in chair    Transfers Overall transfer level: Independent                    Balance Overall balance assessment: No apparent balance deficits (not formally assessed)                                         ADL either performed or assessed with clinical judgement   ADL Overall ADL's : Needs assistance/impaired Eating/Feeding: Independent   Grooming: Wash/dry hands;Independent   Upper Body Bathing: Minimal assistance;Standing   Lower Body Bathing: Independent   Upper Body Dressing : Minimal assistance;Standing Upper Body Dressing Details (indicate cue type and reason): to thread L UE into shirt 2* nerve block Lower Body Dressing: Minimal assistance;Sitting/lateral leans;Sit to/from stand Lower Body Dressing Details (indicate cue type and reason): able to don pants, udnerwear needed assist with socks Toilet Transfer: Independent;Ambulation   Toileting- Clothing Manipulation and Hygiene: Independent       Functional mobility during ADLs: Independent General ADL Comments: patient and spouse educated in compensatory strategies for self care tasks in order to maintain shoulder precautions                  Pertinent Vitals/Pain Pain Assessment: Faces Faces Pain Scale: Hurts a little bit Pain Location: L shoulder Pain Descriptors / Indicators: Numbness;Heaviness Pain Intervention(s): Monitored during session     Hand Dominance Right   Extremity/Trunk Assessment Upper  Extremity Assessment Upper Extremity Assessment: LUE deficits/detail LUE Deficits / Details: + nerve block   Lower Extremity Assessment Lower Extremity Assessment: Overall WFL for tasks assessed   Cervical / Trunk Assessment Cervical / Trunk Assessment: Normal   Communication Communication Communication: No difficulties   Cognition Arousal/Alertness:  Awake/alert Behavior During Therapy: WFL for tasks assessed/performed Overall Cognitive Status: Within Functional Limits for tasks assessed                                        Exercises Exercises: Shoulder   Shoulder Instructions Shoulder Instructions Donning/doffing shirt without moving shoulder: Minimal assistance;Patient able to independently direct caregiver Method for sponge bathing under operated UE: Minimal assistance;Patient able to independently direct caregiver Donning/doffing sling/immobilizer: Moderate assistance;Patient able to independently direct caregiver Correct positioning of sling/immobilizer: Independent;Patient able to independently direct caregiver Pendulum exercises (written home exercise program): Patient able to independently direct caregiver ROM for elbow, wrist and digits of operated UE: Patient able to independently direct caregiver Sling wearing schedule (on at all times/off for ADL's): Patient able to independently direct caregiver Proper positioning of operated UE when showering: Patient able to independently direct caregiver Dressing change:  (N/A) Positioning of UE while sleeping: Patient able to independently direct caregiver    Home Living Family/patient expects to be discharged to:: Private residence Living Arrangements: Spouse/significant other                                      Prior Functioning/Environment Level of Independence: Independent                 OT Problem List: Pain;Impaired UE functional use;Decreased knowledge of precautions         OT Goals(Current goals can be found in the care plan section) Acute Rehab OT Goals Patient Stated Goal: home OT Goal Formulation: All assessment and education complete, DC therapy   AM-PAC OT "6 Clicks" Daily Activity     Outcome Measure Help from another person eating meals?: None Help from another person taking care of personal grooming?: None Help  from another person toileting, which includes using toliet, bedpan, or urinal?: None Help from another person bathing (including washing, rinsing, drying)?: A Little Help from another person to put on and taking off regular upper body clothing?: A Little Help from another person to put on and taking off regular lower body clothing?: A Little 6 Click Score: 21   End of Session Equipment Utilized During Treatment: Other (comment) (sling) Nurse Communication: Other (comment) (OT complete)  Activity Tolerance: Patient tolerated treatment well Patient left: in chair;with call bell/phone within reach  OT Visit Diagnosis: Pain Pain - Right/Left: Left Pain - part of body: Shoulder                Time: 1100-1133 OT Time Calculation (min): 33 min Charges:  OT General Charges $OT Visit: 1 Visit OT Evaluation $OT Eval Low Complexity: 1 Low OT Treatments $Self Care/Home Management : 8-22 mins  Delbert Phenix OT OT pager: [phone]   Rosemary Holms 07/01/2020, 11:42 AM

## 2020-07-01 NOTE — Anesthesia Preprocedure Evaluation (Addendum)
Anesthesia Evaluation  Patient identified by MRN, date of birth, ID band Patient awake    Reviewed: Allergy & Precautions, NPO status , Patient's Chart, lab work & pertinent test results  Airway Mallampati: II  TM Distance: >3 FB Neck ROM: Full    Dental  (+) Edentulous Upper, Edentulous Lower, Dental Advisory Given   Pulmonary neg pulmonary ROS, former smoker,    Pulmonary exam normal breath sounds clear to auscultation       Cardiovascular hypertension, Pt. on medications Normal cardiovascular exam Rhythm:Regular Rate:Normal     Neuro/Psych negative neurological ROS  negative psych ROS   GI/Hepatic Neg liver ROS, GERD  Controlled and Medicated,  Endo/Other  negative endocrine ROS  Renal/GU negative Renal ROS  negative genitourinary   Musculoskeletal negative musculoskeletal ROS (+)   Abdominal   Peds  Hematology negative hematology ROS (+) thrombocytopenia   Anesthesia Other Findings   Reproductive/Obstetrics                            Anesthesia Physical Anesthesia Plan  ASA: II  Anesthesia Plan: General and Regional   Post-op Pain Management:  Regional for Post-op pain   Induction: Intravenous  PONV Risk Score and Plan: 2 and Midazolam, Dexamethasone and Ondansetron  Airway Management Planned: Oral ETT  Additional Equipment:   Intra-op Plan:   Post-operative Plan: Extubation in OR  Informed Consent: I have reviewed the patients History and Physical, chart, labs and discussed the procedure including the risks, benefits and alternatives for the proposed anesthesia with the patient or authorized representative who has indicated his/her understanding and acceptance.     Dental advisory given  Plan Discussed with: CRNA  Anesthesia Plan Comments:         Anesthesia Quick Evaluation

## 2020-07-01 NOTE — Transfer of Care (Signed)
Immediate Anesthesia Transfer of Care Note  Patient: Edwin Silva  Procedure(s) Performed: REVERSE SHOULDER ARTHROPLASTY (Left Shoulder)  Patient Location: PACU  Anesthesia Type:GA combined with regional for post-op pain  Level of Consciousness: drowsy  Airway & Oxygen Therapy: Patient Spontanous Breathing and Patient connected to face mask  Post-op Assessment: Report given to RN and Post -op Vital signs reviewed and stable  Post vital signs: Reviewed and stable  Last Vitals:  Vitals Value Taken Time  BP 135/77 07/01/20 0937  Temp    Pulse 74 07/01/20 0939  Resp 15 07/01/20 0939  SpO2 100 % 07/01/20 0939  Vitals shown include unvalidated device data.  Last Pain:  Vitals:   07/01/20 0545  TempSrc: Oral  PainSc: 0-No pain      Patients Stated Pain Goal: 4 (27/67/01 1003)  Complications: No complications documented.

## 2020-07-01 NOTE — H&P (Signed)
[facility]    Chief Complaint: Left shoulder rotator cuff tear arthropathy HPI: The patient is a 60 y.o. male with chronic and progressively increasing left shoulder pain related to severe rotator cuff tear arthropathy.  Due to his increasing functional limitations and failure to respond to prolonged attempts at conservative management, he is brought to the operating room at this time for planned left shoulder reverse arthroplasty  Past Medical History:  Diagnosis Date  . GERD (gastroesophageal reflux disease)   . Hypertension   . Obesity (BMI 30-39.9)   . Special screening for malignant neoplasms, colon 03/01/2016  . Thrombocytopenia ([place]) 03/01/2016    Past Surgical History:  Procedure Laterality Date  . anterio cervical neck surgery     . BIOPSY  03/20/2016   Procedure: BIOPSY;  Surgeon: Danie Binder, MD;  Location: [facility];  Service: Endoscopy;;  gastric  . COLONOSCOPY N/A 03/20/2016   Procedure: COLONOSCOPY;  Surgeon: Danie Binder, MD;  Location: [facility];  Service: Endoscopy;  Laterality: N/A;  100  . ESOPHAGOGASTRODUODENOSCOPY N/A 03/20/2016   Procedure: ESOPHAGOGASTRODUODENOSCOPY (EGD);  Surgeon: Danie Binder, MD;  Location: [facility];  Service: Endoscopy;  Laterality: N/A;  . INNER EAR SURGERY Left   . LASIK Bilateral   . MULTIPLE TOOTH EXTRACTIONS     AGE 49  . POLYPECTOMY  03/20/2016   Procedure: POLYPECTOMY;  Surgeon: Danie Binder, MD;  Location: [facility];  Service: Endoscopy;;  multiple colon gastric polyp esophageal nodule  . SHOULDER SURGERY Right   . SHOULDER SURGERY Left    SHAVED BONE SPUR  . SURGERY, KNEE Left     Family History  Problem Relation Age of Onset  . Colon polyps Brother     Social History:  reports that he quit smoking about 18 years ago. He has a 75.00 pack-year smoking history. He has quit using smokeless tobacco. He reports that he does not drink alcohol and does not use drugs.   Medications Prior to  Admission  Medication Sig Dispense Refill  . carboxymethylcellulose (REFRESH PLUS) 0.5 % SOLN Place 1 drop into both eyes 3 (three) times daily as needed (dry eyes).    Marland Kitchen ibuprofen (ADVIL,MOTRIN) 200 MG tablet Take 800 mg by mouth every 6 (six) hours as needed for mild pain or moderate pain.     Marland Kitchen lisinopril (ZESTRIL) 5 MG tablet Take 5 mg by mouth daily.    . Multiple Vitamins-Minerals (MULTIVITAMIN WITH MINERALS) tablet Take 1 tablet by mouth daily.    Marland Kitchen omeprazole (PRILOSEC) 40 MG capsule Take 40 mg by mouth daily.    Marland Kitchen OVER THE COUNTER MEDICATION Take 3 tablets by mouth daily. Joint Lax    . selenium 50 MCG TABS tablet Take 50 mcg by mouth daily.    . tamsulosin (FLOMAX) 0.4 MG CAPS capsule Take 0.4 mg by mouth daily. Pt takes after eating due to makes him nauseated.    . vitamin B-12 (CYANOCOBALAMIN) 1000 MCG tablet Take 1 tablet (1,000 mcg total) by mouth daily. (Patient taking differently: Take 1,000 mcg by mouth once a week.) 60 tablet 1  . zinc gluconate 50 MG tablet Take 50 mg by mouth daily.       Physical Exam: Left shoulder demonstrates painful and guarded range of motion as noted at his recent office visit.  He has well-healed previous arthroscopy portals.  He is neurovascular intact distally in the left upper extremity.  Plain radiographs confirm changes consistent with advanced glenohumeral  arthritis as well as narrowing of the acromiohumeral interval and additional changes consistent with severe rotator cuff tear arthropathy  Vitals  Temp:  [98.5 F (36.9 C)] 98.5 F (36.9 C) (06/02 0545) Pulse Rate:  [63] 63 (06/02 0545) Resp:  [16] 16 (06/02 0545) BP: (135)/(89) 135/89 (06/02 0545) SpO2:  [99 %] 99 % (06/02 0545) Weight:  [110.7 kg] 110.7 kg (06/02 0545)  Assessment/Plan  Impression: Left shoulder rotator cuff tear arthropathy  Plan of Action: Procedure(s): REVERSE SHOULDER ARTHROPLASTY  Harmonii Karle M Demar Shad 07/01/2020, 6:35 AM Contact # [phone]

## 2020-07-01 NOTE — Anesthesia Procedure Notes (Signed)
Procedure Name: Intubation Date/Time: 07/01/2020 7:48 AM Performed by: Claudia Desanctis, CRNA Pre-anesthesia Checklist: Patient identified, Emergency Drugs available, Suction available and Patient being monitored Patient Re-evaluated:Patient Re-evaluated prior to induction Oxygen Delivery Method: Circle system utilized Preoxygenation: Pre-oxygenation with 100% oxygen Induction Type: IV induction Ventilation: Mask ventilation without difficulty Laryngoscope Size: 2 and Miller Grade View: Grade I Tube type: Oral Tube size: 7.5 mm Number of attempts: 1 Airway Equipment and Method: Stylet Placement Confirmation: ETT inserted through vocal cords under direct vision,  positive ETCO2 and breath sounds checked- equal and bilateral Secured at: 21 cm Tube secured with: Tape Dental Injury: Teeth and Oropharynx as per pre-operative assessment

## 2020-07-01 NOTE — Anesthesia Procedure Notes (Signed)
Anesthesia Regional Block: Interscalene brachial plexus block   Pre-Anesthetic Checklist: ,, timeout performed, Correct Patient, Correct Site, Correct Laterality, Correct Procedure, Correct Position, site marked, Risks and benefits discussed,  Surgical consent,  Pre-op evaluation,  At surgeon's request and post-op pain management  Laterality: Left  Prep: Maximum Sterile Barrier Precautions used, chloraprep       Needles:  Injection technique: Single-shot  Needle Type: Echogenic Stimulator Needle     Needle Length: 4cm  Needle Gauge: 22     Additional Needles:   Procedures:,,,, ultrasound used (permanent image in chart),,,,  Narrative:  Start time: 07/01/2020 7:10 AM End time: 07/01/2020 7:17 AM Injection made incrementally with aspirations every 5 mL.  Performed by: Personally  Anesthesiologist: Freddrick March, MD  Additional Notes: Monitors applied. No increased pain on injection. No increased resistance to injection. Injection made in 5cc increments. Good needle visualization. Patient tolerated procedure well.

## 2020-07-01 NOTE — Discharge Instructions (Signed)
 Kevin M. Supple, M.D., F.A.A.O.S. Orthopaedic Surgery Specializing in Arthroscopic and Reconstructive Surgery of the Shoulder [phone] [address] - [place], [place] [address] - Fax [phone]   POST-OP TOTAL SHOULDER REPLACEMENT INSTRUCTIONS  1. Follow up in the office for your first post-op appointment 10-14 days from the date of your surgery. If you do not already have a scheduled appointment, our office will contact you to schedule.  2. The bandage over your incision is waterproof. You may begin showering with this dressing on. You may leave this dressing on until first follow up appointment within 2 weeks. We prefer you leave this dressing in place until follow up however after 5-7 days if you are having itching or skin irritation and would like to remove it you may do so. Go slow and tug at the borders gently to break the bond the dressing has with the skin. At this point if there is no drainage it is okay to go without a bandage or you may cover it with a light guaze and tape. You can also expect significant bruising around your shoulder that will drift down your arm and into your chest wall. This is very normal and should resolve over several days.   3. Wear your sling/immobilizer at all times except to perform the exercises below or to occasionally let your arm dangle by your side to stretch your elbow. You also need to sleep in your sling immobilizer until instructed otherwise. It is ok to remove your sling if you are sitting in a controlled environment and allow your arm to rest in a position of comfort by your side or on your lap with pillows to give your neck and skin a break from the sling. You may remove it to allow arm to dangle by side to shower. If you are up walking around and when you go to sleep at night you need to wear it.  4. Range of motion to your elbow, wrist, and hand are encouraged 3-5 times daily. Exercise to your hand and fingers helps to reduce  swelling you may experience.   5. Prescriptions for a pain medication and a muscle relaxant are provided for you. It is recommended that if you are experiencing pain that you pain medication alone is not controlling, add the muscle relaxant along with the pain medication which can give additional pain relief. The first 1-2 days is generally the most severe of your pain and then should gradually decrease. As your pain lessens it is recommended that you decrease your use of the pain medications to an "as needed basis'" only and to always comply with the recommended dosages of the pain medications.  6. Pain medications can produce constipation along with their use. If you experience this, the use of an over the counter stool softener or laxative daily is recommended.   7. For additional questions or concerns, please do not hesitate to call the office. If after hours there is an answering service to forward your concerns to the physician on call.  8.Pain control following an exparel block  To help control your post-operative pain you received a nerve block  performed with Exparel which is a long acting anesthetic (numbing agent) which can provide pain relief and sensations of numbness (and relief of pain) in the operative shoulder and arm for up to 3 days. Sometimes it provides mixed relief, meaning you may still have numbness in certain areas of the arm but can still be able to   move  parts of that arm, hand, and fingers. We recommend that your prescribed pain medications  be used as needed. We do not feel it is necessary to "pre medicate" and "stay ahead" of pain.  Taking narcotic pain medications when you are not having any pain can lead to unnecessary and potentially dangerous side effects.    9. Use the ice machine as much as possible in the first 5-7 days from surgery, then you can wean its use to as needed. The ice typically needs to be replaced every 6 hours, instead of ice you can actually freeze  water bottles to put in the cooler and then fill water around them to avoid having to purchase ice. You can have spare water bottles freezing to allow you to rotate them once they have melted. Try to have a thin shirt or light cloth or towel under the ice wrap to protect your skin.   FOR ADDITIONAL INFO ON ICE MACHINE AND INSTRUCTIONS GO TO THE WEBSITE AT  https://www.djogl[employer].com/products/donjoy/donjoy-iceman-classic3  10.  We recommend that you avoid any dental work or cleaning in the first 3 months following your joint replacement. This is to help minimize the possibility of infection from the bacteria in your mouth that enters your bloodstream during dental work. We also recommend that you take an antibiotic prior to your dental work for the first year after your shoulder replacement to further help reduce that risk. Please simply contact our office for antibiotics to be sent to your pharmacy prior to dental work.  11. Dental Antibiotics:  In most cases prophylactic antibiotics for Dental procdeures after total joint surgery are not necessary.  Exceptions are as follows:  1. History of prior total joint infection  2. Severely immunocompromised (Organ Transplant, cancer chemotherapy, Rheumatoid biologic meds such as Humera)  3. Poorly controlled diabetes (A1C &gt; 8.0, blood glucose over 200)  If you have one of these conditions, contact your surgeon for an antibiotic prescription, prior to your dental procedure.   POST-OP EXERCISES  Pendulum Exercises  Perform pendulum exercises while standing and bending at the waist. Support your uninvolved arm on a table or chair and allow your operated arm to hang freely. Make sure to do these exercises passively - not using you shoulder muscles. These exercises can be performed once your nerve block effects have worn off.  Repeat 20 times. Do 3 sessions per day.     

## 2020-07-01 NOTE — Anesthesia Postprocedure Evaluation (Signed)
Anesthesia Post Note  Patient: Edwin Silva  Procedure(s) Performed: REVERSE SHOULDER ARTHROPLASTY (Left Shoulder)     Patient location during evaluation: PACU Anesthesia Type: Regional and General Level of consciousness: awake and alert Pain management: pain level controlled Vital Signs Assessment: post-procedure vital signs reviewed and stable Respiratory status: spontaneous breathing, nonlabored ventilation, respiratory function stable and patient connected to nasal cannula oxygen Cardiovascular status: blood pressure returned to baseline and stable Postop Assessment: no apparent nausea or vomiting Anesthetic complications: no   No complications documented.  Last Vitals:  Vitals:   07/01/20 1030 07/01/20 1045  BP: 119/75 129/77  Pulse: 63 62  Resp: 16 15  Temp:  (!) 36.3 C  SpO2: 99% 97%    Last Pain:  Vitals:   07/01/20 1045  TempSrc:   PainSc: 0-No pain                 Madyx Delfin L Starlin Steib

## 2020-07-02 ENCOUNTER — Encounter (HOSPITAL_COMMUNITY): Payer: Self-pay | Admitting: Orthopedic Surgery

## 2022-06-08 ENCOUNTER — Encounter: Payer: Self-pay | Admitting: *Deleted

## 2022-10-17 ENCOUNTER — Telehealth: Payer: Self-pay | Admitting: Internal Medicine

## 2022-10-17 NOTE — Telephone Encounter (Signed)
Questionnaire in review.

## 2022-10-19 NOTE — Addendum Note (Signed)
Addended by: Armstead Peaks on: 10/19/2022 12:17 PM   Modules accepted: Orders

## 2022-10-19 NOTE — Telephone Encounter (Signed)
  Procedure: Colonoscopy  Ht: 6'2.5, WT: 242lbs  Have you had a colonoscopy before?  03/20/16, Dr. Darrick Penna  Do you have family history of colon cancer?  no  Do you have a family history of polyps? no  Previous colonoscopy with polyps removed? yes  Do you have a history colorectal cancer?   no  Are you diabetic?  no  Do you have a prosthetic or mechanical heart valve? no  Do you have a pacemaker/defibrillator?   no  Have you had endocarditis/atrial fibrillation?  no  Do you use supplemental oxygen/CPAP?  no  Have you had joint replacement within the last 12 months?  no  Do you tend to be constipated or have to use laxatives?  no   Do you have history of alcohol use? If yes, how much and how often.  no  Do you have history or are you using drugs? If yes, what do are you  using?  no  Have you ever had a stroke/heart attack?  no  Have you ever had a heart or other vascular stent placed,?no  Do you take weight loss medication? No  Do you take any blood-thinning medications such as: (Plavix, aspirin, Coumadin, Aggrenox, Brilinta, Xarelto, Eliquis, Pradaxa, Savaysa or Effient)? no  If yes we need the name, milligram, dosage and who is prescribing doctor:                  No Known Allergies

## 2022-10-19 NOTE — Telephone Encounter (Signed)
  Procedure: Colonoscopy  Height: 6'2.5 Weight: 242lbs  Have you had a colonoscopy before?  03/20/16, Dr. Darrick Penna  Do you have family history of colon cancer?  no  Do you have a family history of polyps? no  Previous colonoscopy with polyps removed? yes  Do you have a history colorectal cancer?   no  Are you diabetic?  no  Do you have a prosthetic or mechanical heart valve? no  Do you have a pacemaker/defibrillator?   no  Have you had endocarditis/atrial fibrillation?  no  Do you use supplemental oxygen/CPAP?  no  Have you had joint replacement within the last 12 months?  no  Do you tend to be constipated or have to use laxatives?  no   Do you have history of alcohol use? If yes, how much and how often.  no  Do you have history or are you using drugs? If yes, what do are you  using?  no  Have you ever had a stroke/heart attack?  no  Have you ever had a heart or other vascular stent placed,?no  Do you take weight loss medication? no  Do you take any blood-thinning medications such as: (Plavix, aspirin, Coumadin, Aggrenox, Brilinta, Xarelto, Eliquis, Pradaxa, Savaysa or Effient)? no  If yes we need the name, milligram, dosage and who is prescribing doctor:               Current Outpatient Medications  Medication Sig Dispense Refill   lisinopril (ZESTRIL) 5 MG tablet Take 5 mg by mouth daily.     Multiple Vitamins-Minerals (MULTIVITAMIN WITH MINERALS) tablet Take 1 tablet by mouth daily.     omeprazole (PRILOSEC) 40 MG capsule Take 40 mg by mouth daily.     No current facility-administered medications for this visit.    No Known Allergies

## 2022-11-01 MED ORDER — NA SULFATE-K SULFATE-MG SULF 17.5-3.13-1.6 GM/177ML PO SOLN: ORAL | 0 refills | Status: DC

## 2022-11-01 NOTE — Telephone Encounter (Signed)
Spoke with pt. He is scheduled for 10/29. Aware will send instructions to him. Rx for prep sent to pharmacy.

## 2022-11-01 NOTE — Telephone Encounter (Signed)
Ok to schedule. ASA 2. He does have mild thrombocytopenia. Most recent labs in May 2024 under [facility] tab with platelets 111 which is ok.

## 2022-11-01 NOTE — Addendum Note (Signed)
Addended by: Armstead Peaks on: 11/01/2022 03:29 PM   Modules accepted: Orders

## 2022-11-02 ENCOUNTER — Encounter: Payer: Self-pay | Admitting: *Deleted

## 2022-11-02 NOTE — Telephone Encounter (Signed)
Referral completed, [facility] apt letter sent to PCP

## 2022-11-28 ENCOUNTER — Encounter (HOSPITAL_COMMUNITY)
Admission: RE | Disposition: A | Discharge status: Home / Self Care | Payer: Self-pay | Source: Home / Self Care | Attending: Internal Medicine

## 2022-11-28 ENCOUNTER — Encounter (HOSPITAL_COMMUNITY): Payer: Self-pay

## 2022-11-28 ENCOUNTER — Ambulatory Visit (HOSPITAL_COMMUNITY)
Admission: RE | Admit: 2022-11-28 | Discharge: 2022-11-28 | Disposition: A | Discharge status: Home / Self Care | Payer: BC Managed Care – PPO | Attending: Internal Medicine | Admitting: Internal Medicine

## 2022-11-28 ENCOUNTER — Other Ambulatory Visit: Payer: Self-pay

## 2022-11-28 ENCOUNTER — Ambulatory Visit (HOSPITAL_COMMUNITY): Payer: Self-pay | Admitting: Certified Registered Nurse Anesthetist

## 2022-11-28 DIAGNOSIS — Z1211 Encounter for screening for malignant neoplasm of colon: Secondary | ICD-10-CM | POA: Diagnosis not present

## 2022-11-28 DIAGNOSIS — Z860101 Personal history of adenomatous and serrated colon polyps: Secondary | ICD-10-CM | POA: Diagnosis not present

## 2022-11-28 DIAGNOSIS — I10 Essential (primary) hypertension: Secondary | ICD-10-CM | POA: Insufficient documentation

## 2022-11-28 DIAGNOSIS — Z87891 Personal history of nicotine dependence: Secondary | ICD-10-CM | POA: Insufficient documentation

## 2022-11-28 HISTORY — PX: COLONOSCOPY WITH PROPOFOL: SHX5780

## 2022-11-28 SURGERY — COLONOSCOPY WITH PROPOFOL
Anesthesia: General

## 2022-11-28 MED ORDER — PROPOFOL 10 MG/ML IV BOLUS
INTRAVENOUS | Status: DC | PRN
  Administered 2022-11-28: 60 mg via INTRAVENOUS

## 2022-11-28 MED ORDER — LACTATED RINGERS IV SOLN: INTRAVENOUS | Status: DC | PRN

## 2022-11-28 MED ORDER — PROPOFOL 500 MG/50ML IV EMUL
INTRAVENOUS | Status: DC | PRN
  Administered 2022-11-28: 150 ug/kg/min via INTRAVENOUS

## 2022-11-28 MED ORDER — SODIUM CHLORIDE 0.9% FLUSH: 10.0000 mL | Freq: Two times a day (BID) | INTRAVENOUS | Status: DC

## 2022-11-28 NOTE — Discharge Instructions (Addendum)
  Colonoscopy Discharge Instructions  Read the instructions outlined below and refer to this sheet in the next few weeks. These discharge instructions provide you with general information on caring for yourself after you leave the hospital. Your doctor may also give you specific instructions. While your treatment has been planned according to the most current medical practices available, unavoidable complications occasionally occur.   ACTIVITY You may resume your regular activity, but move at a slower pace for the next 24 hours.  Take frequent rest periods for the next 24 hours.  Walking will help get rid of the air and reduce the bloated feeling in your belly (abdomen).  No driving for 24 hours (because of the medicine (anesthesia) used during the test).   Do not sign any important legal documents or operate any machinery for 24 hours (because of the anesthesia used during the test).  NUTRITION Drink plenty of fluids.  You may resume your normal diet as instructed by your doctor.  Begin with a light meal and progress to your normal diet. Heavy or fried foods are harder to digest and may make you feel sick to your stomach (nauseated).  Avoid alcoholic beverages for 24 hours or as instructed.  MEDICATIONS You may resume your normal medications unless your doctor tells you otherwise.  WHAT YOU CAN EXPECT TODAY Some feelings of bloating in the abdomen.  Passage of more gas than usual.  Spotting of blood in your stool or on the toilet paper.  IF YOU HAD POLYPS REMOVED DURING THE COLONOSCOPY: No aspirin products for 7 days or as instructed.  No alcohol for 7 days or as instructed.  Eat a soft diet for the next 24 hours.  FINDING OUT THE RESULTS OF YOUR TEST Not all test results are available during your visit. If your test results are not back during the visit, make an appointment with your caregiver to find out the results. Do not assume everything is normal if you have not heard from your  caregiver or the medical facility. It is important for you to follow up on all of your test results.  SEEK IMMEDIATE MEDICAL ATTENTION IF: You have more than a spotting of blood in your stool.  Your belly is swollen (abdominal distention).  You are nauseated or vomiting.  You have a temperature over 101.  You have abdominal pain or discomfort that is severe or gets worse throughout the day.   Unfortunately, your colon was not adequately prepped today for colonoscopy.  I did not see any evidence of colon cancer or large polyps, but certainly could have missed smaller polyps due to poor visualization.  Would recommend repeat colonoscopy in 3-6 months with a different colon prep. Follow up in GI office in 6 weeks to discuss further. OFFICE WILL CALL WITH APPOINTMENT.    I hope you have a great rest of your week!  Hennie Duos. Marletta Lor, D.O. Gastroenterology and Hepatology [facility]

## 2022-11-28 NOTE — Transfer of Care (Signed)
Immediate Anesthesia Transfer of Care Note  Patient: Edwin Silva  Procedure(s) Performed: COLONOSCOPY WITH PROPOFOL  Patient Location: Endoscopy Unit  Anesthesia Type:General  Level of Consciousness: awake, alert , and oriented  Airway & Oxygen Therapy: Patient Spontanous Breathing  Post-op Assessment: Report given to RN, Post -op Vital signs reviewed and stable, Patient moving all extremities X 4, and Patient able to stick tongue midline  Post vital signs: Reviewed and stable  Last Vitals:  Vitals Value Taken Time  BP 100/56   Temp 98.4   Pulse 64   Resp 12   SpO2 95     Last Pain:  Vitals:   11/28/22 1121  TempSrc:   PainSc: 0-No pain      Patients Stated Pain Goal: 5 (11/28/22 1027)  Complications: No notable events documented.

## 2022-11-28 NOTE — H&P (Signed)
Primary Care Physician:  Catha Nottingham, FNP Primary Gastroenterologist:  Dr. Marletta Lor  Pre-Procedure History & Physical: HPI:  Edwin Silva is a 62 y.o. male is here for a colonoscopy to be performed for surveillance purposes, personal history of adenomatous colon polyps in 2018  Past Medical History:  Diagnosis Date   GERD (gastroesophageal reflux disease)    Hypertension    Obesity (BMI 30-39.9)    Special screening for malignant neoplasms, colon 03/01/2016   Thrombocytopenia (HCC) 03/01/2016    Past Surgical History:  Procedure Laterality Date   anterio cervical neck surgery      BIOPSY  03/20/2016   Procedure: BIOPSY;  Surgeon: West Bali, MD;  Location: [facility];  Service: Endoscopy;;  gastric   COLONOSCOPY N/A 03/20/2016   Procedure: COLONOSCOPY;  Surgeon: West Bali, MD;  Location: [facility];  Service: Endoscopy;  Laterality: N/A;  100   ESOPHAGOGASTRODUODENOSCOPY N/A 03/20/2016   Procedure: ESOPHAGOGASTRODUODENOSCOPY (EGD);  Surgeon: West Bali, MD;  Location: [facility];  Service: Endoscopy;  Laterality: N/A;   INNER EAR SURGERY Left    LASIK Bilateral    MULTIPLE TOOTH EXTRACTIONS     AGE 77   POLYPECTOMY  03/20/2016   Procedure: POLYPECTOMY;  Surgeon: West Bali, MD;  Location: [facility];  Service: Endoscopy;;  multiple colon gastric polyp esophageal nodule   REVERSE SHOULDER ARTHROPLASTY Left 07/01/2020   Procedure: REVERSE SHOULDER ARTHROPLASTY;  Surgeon: Francena Hanly, MD;  Location: [facility];  Service: Orthopedics;  Laterality: Left;    SHOULDER SURGERY Right    SHOULDER SURGERY Left    SHAVED BONE SPUR   SURGERY, KNEE Left     Prior to Admission medications   Medication Sig Start Date End Date Taking? Authorizing Provider  lisinopril (ZESTRIL) 5 MG tablet Take 5 mg by mouth daily. 04/22/20  Yes [provider]  Multiple Vitamins-Minerals (MULTIVITAMIN WITH MINERALS) tablet Take 1 tablet by mouth daily.   Yes  [provider]  omeprazole (PRILOSEC) 40 MG capsule Take 40 mg by mouth daily. 04/22/20  Yes [provider]  Na Sulfate-K Sulfate-Mg Sulf 17.5-3.13-1.6 GM/177ML SOLN As directed 11/01/22   Lanelle Bal, DO  tamsulosin (FLOMAX) 0.4 MG CAPS capsule Take 0.4 mg by mouth daily as needed.    [provider]    Allergies as of 11/01/2022   (No Known Allergies)    Family History  Problem Relation Age of Onset   Colon polyps Brother     Social History   Socioeconomic History   Marital status: Married    Spouse name: Not on file   Number of children: 2   Years of education: 1 YEAR COLLEGE   Highest education level: Not on file  Occupational History   Occupation: CONSTRUCTION  Tobacco Use   Smoking status: Former    Current packs/day: 0.00    Average packs/day: 3.0 packs/day for 25.0 years (75.0 ttl pk-yrs)    Types: Cigarettes    Start date: 03/07/1977    Quit date: 03/07/2002    Years since quitting: 20.7   Smokeless tobacco: Former   Tobacco comments:    quit 03/07/02  Vaping Use   Vaping status: Never Used  Substance and Sexual Activity   Alcohol use: No   Drug use: No   Sexual activity: Not on file  Other Topics Concern   Not on file  Social History Narrative   [employer] BEEN WORKING IN BUSINESS 20  YRS. THIS COMPANY BEEN THERE FOR 5 YRS.   Social Determinants of Health   Financial Resource Strain: Not on file  Food Insecurity: Not on file  Transportation Needs: Not on file  Physical Activity: Not on file  Stress: Not on file  Social Connections: Not on file  Intimate Partner Violence: Not on file    Review of Systems: See HPI, otherwise negative ROS  Physical Exam: Vital signs in last 24 hours: Temp:  [98.5 F (36.9 C)] 98.5 F (36.9 C) (10/29 1039) Pulse Rate:  [63] 63 (10/29 1039) Resp:  [16] 16 (10/29 1039) BP: (145)/(80) 145/80 (10/29 1039) SpO2:  [97 %] 97 % (10/29 1039) Weight:  [113.4 kg] 113.4 kg (10/29  1027)   General:   Alert,  Well-developed, well-nourished, pleasant and cooperative in NAD Head:  Normocephalic and atraumatic. Eyes:  Sclera clear, no icterus.   Conjunctiva pink. Ears:  Normal auditory acuity. Nose:  No deformity, discharge,  or lesions. Msk:  Symmetrical without gross deformities. Normal posture. Extremities:  Without clubbing or edema. Neurologic:  Alert and  oriented x4;  grossly normal neurologically. Skin:  Intact without significant lesions or rashes. Psych:  Alert and cooperative. Normal mood and affect.  Impression/Plan: Edwin Silva is here for a colonoscopy to be performed for surveillance purposes, personal history of adenomatous colon polyps in 2018  The risks of the procedure including infection, bleed, or perforation as well as benefits, limitations, alternatives and imponderables have been reviewed with the patient. Questions have been answered. All parties agreeable.

## 2022-11-28 NOTE — Anesthesia Preprocedure Evaluation (Signed)
Anesthesia Evaluation  Patient identified by MRN, date of birth, ID band Patient awake    Reviewed: Allergy & Precautions, H&P , NPO status , Patient's Chart, lab work & pertinent test results, reviewed documented beta blocker date and time   Airway Mallampati: II  TM Distance: >3 FB Neck ROM: full    Dental no notable dental hx.    Pulmonary neg pulmonary ROS, former smoker   Pulmonary exam normal breath sounds clear to auscultation       Cardiovascular Exercise Tolerance: Good hypertension, negative cardio ROS  Rhythm:regular Rate:Normal     Neuro/Psych negative neurological ROS  negative psych ROS   GI/Hepatic negative GI ROS, Neg liver ROS,GERD  ,,  Endo/Other  negative endocrine ROS    Renal/GU negative Renal ROS  negative genitourinary   Musculoskeletal   Abdominal   Peds  Hematology negative hematology ROS (+)   Anesthesia Other Findings   Reproductive/Obstetrics negative OB ROS                             Anesthesia Physical Anesthesia Plan  ASA: 2  Anesthesia Plan: General   Post-op Pain Management:    Induction:   PONV Risk Score and Plan: Propofol infusion  Airway Management Planned:   Additional Equipment:   Intra-op Plan:   Post-operative Plan:   Informed Consent: I have reviewed the patients History and Physical, chart, labs and discussed the procedure including the risks, benefits and alternatives for the proposed anesthesia with the patient or authorized representative who has indicated his/her understanding and acceptance.     Dental Advisory Given  Plan Discussed with: CRNA  Anesthesia Plan Comments:        Anesthesia Quick Evaluation  

## 2022-11-28 NOTE — Op Note (Signed)
[facility] Patient Name: Edwin Silva Procedure Date: 11/28/2022 11:13 AM MRN: [id number] Date of Birth: 1960/02/26 Attending MD: Hennie Duos. Marletta Lor , [place], [id number] CSN: [id number] Age: 63 Admit Type: Outpatient Procedure:                Colonoscopy Indications:              Surveillance: Personal history of adenomatous                            polyps on last colonoscopy > 5 years ago Providers:                Hennie Duos. Marletta Lor, DO, Sheran Fava, Lennice Sites Technician, Technician Referring MD:              Medicines:                See the Anesthesia note for documentation of the                            administered medications Complications:            No immediate complications. Estimated Blood Loss:     Estimated blood loss: none. Procedure:                Pre-Anesthesia Assessment:                           - The anesthesia plan was to use monitored                            anesthesia care (MAC).                           After obtaining informed consent, the colonoscope                            was passed under direct vision. Throughout the                            procedure, the patient's blood pressure, pulse, and                            oxygen saturations were monitored continuously. The                            [id number]-HQ190L ([id number]) scope was introduced through                            the anus and advanced to the the cecum, identified                            by appendiceal orifice and ileocecal valve. The                            colonoscopy was performed without  difficulty. The                            patient tolerated the procedure well. The quality                            of the bowel preparation was evaluated using the                            BBPS [facility] Bowel Preparation Scale) with scores                            of: Right Colon = 1 (portion of mucosa seen, but                             other areas not well seen due to staining, residual                            stool and/or opaque liquid), Transverse Colon = 1                            (portion of mucosa seen, but other areas not well                            seen due to staining, residual stool and/or opaque                            liquid) and Left Colon = 1 (portion of mucosa seen,                            but other areas not well seen due to staining,                            residual stool and/or opaque liquid). The total                            BBPS score equals 3. The quality of the bowel                            preparation was inadequate. Scope In: 11:23:59 AM Scope Out: 11:30:11 AM Scope Withdrawal Time: 0 hours 3 minutes 34 seconds  Total Procedure Duration: 0 hours 6 minutes 12 seconds  Findings:      Extensive amounts of semi-solid stool was found in the entire colon,       precluding visualization. Impression:               - Preparation of the colon was inadequate.                           - Stool in the entire examined colon.                           - No specimens collected. Moderate Sedation:  Per Anesthesia Care Recommendation:           - Patient has a contact number available for                            emergencies. The signs and symptoms of potential                            delayed complications were discussed with the                            patient. Return to normal activities tomorrow.                            Written discharge instructions were provided to the                            patient.                           - Resume previous diet.                           - Continue present medications.                           - Repeat colonoscopy in 3 months because the bowel                            preparation was poor.                           - Return to GI clinic in 6 weeks to discuss                            further. Patient will likely need EGD  at same time                            as repeat colonoscopy for Barrett's surveillance.                            Also needs work up for thrombocytopenia, ?cirrhosis Procedure Code(s):        --- Professional ---                           G0105, Colorectal cancer screening; colonoscopy on                            individual at high risk Diagnosis Code(s):        --- Professional ---                           Z86.010, Personal history of colonic polyps CPT copyright 2022 American Medical Association. All rights reserved. The codes documented in this report are preliminary and upon coder review may  be revised to meet current compliance requirements. Hennie Duos.  Marletta Lor, DO Hennie Duos. Marletta Lor, DO 11/28/2022 11:36:26 AM This report has been signed electronically. Number of Addenda: 0

## 2022-12-04 NOTE — Anesthesia Postprocedure Evaluation (Signed)
Anesthesia Post Note  Patient: Edwin Silva  Procedure(s) Performed: COLONOSCOPY WITH PROPOFOL  Patient location during evaluation: Phase II Anesthesia Type: General Level of consciousness: awake Pain management: pain level controlled Vital Signs Assessment: post-procedure vital signs reviewed and stable Respiratory status: spontaneous breathing and respiratory function stable Cardiovascular status: blood pressure returned to baseline and stable Postop Assessment: no headache and no apparent nausea or vomiting Anesthetic complications: no Comments: Late entry   No notable events documented.   Last Vitals:  Vitals:   11/28/22 1039 11/28/22 1133  BP: (!) 145/80 (!) 100/56  Pulse: 63 65  Resp: 16 (!) 23  Temp: 36.9 C 36.9 C  SpO2: 97% 94%    Last Pain:  Vitals:   11/28/22 1133  TempSrc: Oral  PainSc: 0-No pain                 Windell Norfolk

## 2022-12-05 ENCOUNTER — Encounter (HOSPITAL_COMMUNITY): Payer: Self-pay | Admitting: Internal Medicine

## 2023-01-17 NOTE — Progress Notes (Unsigned)
Referring Provider: Catha Nottingham, FNP Primary Care Physician:  Catha Nottingham, FNP Primary GI Physician: Dr. Marletta Lor  Chief Complaint  Patient presents with   Colonoscopy    Colonoscopy screening     HPI:   Edwin Silva is a 62 y.o. male presenting today to discuss rescheduling his surveillance colonoscopy.  He has history of adenomatous colon polyps and underwent surveillance colonoscopy 11/28/2022, but his prep was inadequate with stool throughout the entire colon.  He was advised to follow-up in the GI clinic to regroup and repeat colonoscopy in 3 months.   Today:  Reports he completed the entire bowel prep as prescribed, but states it did not work.  He had Suprep. Reports he is taking the iron tablet daily as he was recently told that he has low iron and this causes some mild constipation, but currently having bowel movements daily.  He does eat a lot of vegetables to help with this.  He thinks that his low iron is coming from the fact that he gives blood 5 times a year.  He has not seen any blood in his stools or black stools.  Stools have become a little darker since starting iron.  No abdominal pain, nausea, vomiting. Chronic GERD well controlled with omeprazole 40 mg daily.   NSAIDs: 800 mg ibuprofen daily nightly.   History of Barrett's esophagus noted on EGD in February 2018 and is overdue for surveillance EGD.  Also with history of thrombocytopenia.  [place] in 2018 with fatty liver.  Drank heavily in the 90s, but not since then. No alcohol at all in the last 4-5 years.  LFTs normal in November 2024.   Last colonoscopy February 2018: -Eight 5-8 mm polyps resected and retrieved - Two 3-5 mm polyps resected and retrieved - Internal hemorrhoids.  Pathology: Tubular adenomas, sessile serrated polyps with 1 fragment containing low-grade dysplasia, hyperplastic polyp.  Recommended repeat colonoscopy in 1 year.  Last EGD February 2018: - PEPTIC STRICTURE  -  Esophageal polyp( s) were found. Resected and retrieved. - Small hiatal hernia.  - A single gastric polyp. Resected and retrieved.  - MODERATE Gastritis AND MILD DUODENITIS  Pathology: Chronic gastritis with intestinal metaplasia, hyperplastic polyp, Barrett's esophagus.  Recommended repeat EGD in 3 to 5 years.  Past Medical History:  Diagnosis Date   GERD (gastroesophageal reflux disease)    Hypertension    Obesity (BMI 30-39.9)    Special screening for malignant neoplasms, colon 03/01/2016   Thrombocytopenia (HCC) 03/01/2016    Past Surgical History:  Procedure Laterality Date   anterio cervical neck surgery      BIOPSY  03/20/2016   Procedure: BIOPSY;  Surgeon: West Bali, MD;  Location: [facility];  Service: Endoscopy;;  gastric   COLONOSCOPY N/A 03/20/2016   Procedure: COLONOSCOPY;  Surgeon: West Bali, MD;  Location: [facility];  Service: Endoscopy;  Laterality: N/A;  100   COLONOSCOPY WITH PROPOFOL N/A 11/28/2022   Surgeon: Lanelle Bal, DO; poor prep   ESOPHAGOGASTRODUODENOSCOPY N/A 03/20/2016   Procedure: ESOPHAGOGASTRODUODENOSCOPY (EGD);  Surgeon: West Bali, MD;  Location: [facility];  Service: Endoscopy;  Laterality: N/A;   INNER EAR SURGERY Left    LASIK Bilateral    MULTIPLE TOOTH EXTRACTIONS     AGE 35   POLYPECTOMY  03/20/2016   Procedure: POLYPECTOMY;  Surgeon: West Bali, MD;  Location: [facility];  Service: Endoscopy;;  multiple colon gastric polyp esophageal nodule  REVERSE SHOULDER ARTHROPLASTY Left 07/01/2020   Procedure: REVERSE SHOULDER ARTHROPLASTY;  Surgeon: Francena Hanly, MD;  Location: [facility];  Service: Orthopedics;  Laterality: Left;    SHOULDER SURGERY Right    SHOULDER SURGERY Left    SHAVED BONE SPUR   SURGERY, KNEE Left     Current Outpatient Medications  Medication Sig Dispense Refill   lisinopril (ZESTRIL) 5 MG tablet Take 5 mg by mouth daily.     Multiple Vitamins-Minerals (MULTIVITAMIN WITH  MINERALS) tablet Take 1 tablet by mouth daily.     omeprazole (PRILOSEC) 40 MG capsule Take 40 mg by mouth daily.     SV IRON 325 (65 Fe) MG tablet Take 325 mg by mouth daily.     tamsulosin (FLOMAX) 0.4 MG CAPS capsule Take 0.4 mg by mouth daily as needed.     Vitamin D, Ergocalciferol, (DRISDOL) 1.25 MG (50000 UNIT) CAPS capsule Take 50,000 Units by mouth once a week.     Sildenafil Citrate (VIAGRA PO)      No current facility-administered medications for this visit.    Allergies as of 01/18/2023   (No Known Allergies)    Family History  Problem Relation Age of Onset   Colon polyps Brother     Social History   Socioeconomic History   Marital status: Married    Spouse name: Not on file   Number of children: 2   Years of education: 1 YEAR COLLEGE   Highest education level: Not on file  Occupational History   Occupation: CONSTRUCTION  Tobacco Use   Smoking status: Former    Current packs/day: 0.00    Average packs/day: 3.0 packs/day for 25.0 years (75.0 ttl pk-yrs)    Types: Cigarettes    Start date: 03/07/1977    Quit date: 03/07/2002    Years since quitting: 20.8   Smokeless tobacco: Former   Tobacco comments:    quit 03/07/02  Vaping Use   Vaping status: Never Used  Substance and Sexual Activity   Alcohol use: No   Drug use: No   Sexual activity: Not on file  Other Topics Concern   Not on file  Social History Narrative   [employer] 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 THIS COMPANY BEEN THERE FOR 5 YRS.   Social Drivers of Corporate investment banker Strain: Not on file  Food Insecurity: Not on file  Transportation Needs: Not on file  Physical Activity: Not on file  Stress: Not on file  Social Connections: Not on file    Review of Systems: Gen: Denies fever, chills, cold or flulike symptoms, presyncope, syncope. CV: Denies chest pain, palpitations. Resp: Denies dyspnea, cough. GI: See HPI Heme: See HPI  Physical Exam: BP 139/85 (BP Location: Left  Arm, Patient Position: Sitting, Cuff Size: Large)   Pulse 71   Temp 97.9 F (36.6 C) (Temporal)   Ht 6\' 2"  (1.88 m)   Wt 254 lb 6.4 oz (115.4 kg)   BMI 32.66 kg/m  General:   Alert and oriented. No distress noted. Pleasant and cooperative.  Head:  Normocephalic and atraumatic. Eyes:  Conjuctiva clear without scleral icterus. Heart:  S1, S2 present without murmurs appreciated. Lungs:  Clear to auscultation bilaterally. No wheezes, rales, or rhonchi. No distress.  Abdomen:  +BS, soft, non-tender and non-distended. No rebound or guarding. No HSM or masses noted. Msk:  Symmetrical without gross deformities. Normal posture. Extremities:  Without edema. Neurologic:  Alert and  oriented x4 Psych:  Normal  mood and affect.    Assessment:  62 year old male with history of HTN, thrombocytopenia, fatty liver, GERD, Barrett's esophagus, adenomatous colon polyps, presenting today to discuss rescheduling colonoscopy due to incomplete bowel prep.  History of adenomatous colon polyps: Overdue for surveillance.  Last complete colonoscopy February 2018 with multiple polyps removed with pathology showing tubular adenomas, sessile serrated polyps with 1 fragment containing low-grade dysplasia, hyperplastic polyp.  He was advised to repeat a colonoscopy in 1 year.  Recently attempted colonoscopy 11/28/2022 after completing Suprep, but patient had stool throughout his entire colon and states today that his prep did not work.  He has had some mild constipation recently after starting oral iron, but otherwise nothing significant.  Recommended rescheduling colonoscopy with TriLyte prep and would also give Linzess prior to augment his prep, but patient has requested to hold off on scheduling until he discusses with his insurance company due to cost.  History Barrett's esophagus: Overdue for surveillance EGD.  Patient discussing with his insurance company prior to scheduling.  Thrombocytopenia: Chronic.  Prior  ultrasound in 2018 did not show any evidence of cirrhosis, but he did have fatty liver.  He was referred to hematology and no significant abnormalities were found.  He has not seen them since 2018.  Most recent labs in November showed platelets of 125.  I will update his ultrasound to follow-up on fatty liver to ensure that he has not had any progression to cirrhosis and has no evidence of splenomegaly.    Fatty liver:  Noted on ultrasound in 2018.  Recent LFTs within normal limits.  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  Counseled on fatty liver.  Written instructions provided.  Iron deficiency without anemia: Recent labs in November showed low iron, iron saturation, and ferritin, but hemoglobin remains normal at 13.3.  Patient denies PR or melena.  He does chronically take 800 mg ibuprofen at night putting him at risk for peptic ulcer disease, but currently denying any significant GI symptoms.  He is chronically on omeprazole 40 mg daily for GERD which is well-controlled.  He is well overdue for colonoscopy and also overdue for surveillance EGD due to history of Barrett's esophagus.  I have recommended updating both colonoscopy and EGD, but patient plans to discuss with his insurance company first due to cost.  Plan:  Recommended EGD and colonoscopy.  Patient going to discuss with his insurance company and let us know if he would like to proceed. Abdominal ultrasound Continue omeprazole 40 mg daily Continue iron daily Avoid NSAIDs.  Instructions for fatty liver: Recommend 1-2# weight loss per week until ideal body weight through exercise & diet. Low fat/cholesterol diet.   Avoid sweets, sodas, fruit juices, sweetened beverages like tea, etc. Gradually increase exercise from 15 min daily up to 1 hr per day 5 days/week. Continue to avoid alcohol use.    Ermalinda Memos, PA-C [facility]  Gastroenterology 01/18/2023

## 2023-01-18 ENCOUNTER — Encounter: Payer: Self-pay | Admitting: Gastroenterology

## 2023-01-18 ENCOUNTER — Ambulatory Visit (INDEPENDENT_AMBULATORY_CARE_PROVIDER_SITE_OTHER): Payer: BC Managed Care – PPO | Admitting: Gastroenterology

## 2023-01-18 VITALS — BP 139/85 | HR 71 | Temp 97.9°F | Ht 74.0 in | Wt 254.4 lb

## 2023-01-18 DIAGNOSIS — E611 Iron deficiency: Secondary | ICD-10-CM | POA: Diagnosis not present

## 2023-01-18 DIAGNOSIS — K760 Fatty (change of) liver, not elsewhere classified: Secondary | ICD-10-CM

## 2023-01-18 DIAGNOSIS — F1091 Alcohol use, unspecified, in remission: Secondary | ICD-10-CM

## 2023-01-18 DIAGNOSIS — D696 Thrombocytopenia, unspecified: Secondary | ICD-10-CM | POA: Diagnosis not present

## 2023-01-18 DIAGNOSIS — Z860101 Personal history of adenomatous and serrated colon polyps: Secondary | ICD-10-CM

## 2023-01-18 DIAGNOSIS — Z8719 Personal history of other diseases of the digestive system: Secondary | ICD-10-CM

## 2023-01-18 DIAGNOSIS — Z860102 Personal history of hyperplastic colon polyps: Secondary | ICD-10-CM

## 2023-01-18 NOTE — Patient Instructions (Addendum)
I would like to get you scheduled for a colonoscopy and an upper endoscopy due to history of adenomatous colon polyps, Barrett's esophagus, and now with low iron.  Please call back and let me know if you would like to proceed.  I recommend that you avoid NSAID products including ibuprofen, Aleve, Advil, BC powders, Goody powders, and anything that says "NSAID" on the package.  Recommend that you use Tylenol first for pain.  No more than 3000 mg of Tylenol per 24 hours.  We will get scheduled for an ultrasound of your abdomen to follow-up on fatty liver and to recheck your liver and spleen due to your history of low platelets.  Instructions for fatty liver: Recommend 1-2# weight loss per week until ideal body weight through exercise & diet. Low fat/cholesterol diet.   Avoid sweets, sodas, fruit juices, sweetened beverages like tea, etc. Gradually increase exercise from 15 min daily up to 1 hr per day 5 days/week. Continue to avoid alcohol use.   It was very nice to meet you today!  I hope you have a fantastic Christmas!  Ermalinda Memos, PA-C [facility] Gastroenterology

## 2023-01-22 ENCOUNTER — Ambulatory Visit: Payer: BC Managed Care – PPO | Admitting: Gastroenterology

## 2023-01-23 ENCOUNTER — Other Ambulatory Visit (HOSPITAL_COMMUNITY): Payer: BC Managed Care – PPO

## 2023-01-26 ENCOUNTER — Ambulatory Visit (HOSPITAL_COMMUNITY)
Admission: RE | Admit: 2023-01-26 | Discharge: 2023-01-26 | Disposition: A | Discharge status: Home / Self Care | Payer: BC Managed Care – PPO | Source: Ambulatory Visit | Attending: Gastroenterology | Admitting: Gastroenterology

## 2023-01-26 DIAGNOSIS — D696 Thrombocytopenia, unspecified: Secondary | ICD-10-CM | POA: Diagnosis present

## 2023-01-26 DIAGNOSIS — K760 Fatty (change of) liver, not elsewhere classified: Secondary | ICD-10-CM | POA: Diagnosis present
